# Patient Record
Sex: Female | Born: 1955 | Race: White | Hispanic: No | Marital: Single
Health system: Midwestern US, Academic
[De-identification: ages and names within clinical notes are randomized; demographics above are authoritative.]

---

## 2016-09-16 MED ORDER — AMIODARONE 200 MG PO TAB
100 mg | ORAL_TABLET | Freq: Every day | ORAL | 6 refills | 42.00000 days | Status: DC
Start: 2016-09-16 — End: 2017-06-11

## 2016-10-23 ENCOUNTER — Ambulatory Visit: Admit: 2016-10-23 | Discharge: 2016-10-24 | Payer: MEDICARE

## 2016-10-24 DIAGNOSIS — I4891 Unspecified atrial fibrillation: ICD-10-CM

## 2016-10-24 DIAGNOSIS — I10 Essential (primary) hypertension: ICD-10-CM

## 2016-10-24 DIAGNOSIS — I480 Paroxysmal atrial fibrillation: Principal | ICD-10-CM

## 2016-11-09 ENCOUNTER — Ambulatory Visit: Admit: 2016-11-09 | Discharge: 2016-11-10 | Payer: MEDICARE

## 2016-11-09 ENCOUNTER — Encounter: Admit: 2016-11-09 | Discharge: 2016-11-09 | Payer: MEDICARE

## 2016-11-09 ENCOUNTER — Ambulatory Visit: Admit: 2016-11-09 | Discharge: 2016-11-09 | Payer: MEDICARE

## 2016-11-09 DIAGNOSIS — I481 Persistent atrial fibrillation: Principal | ICD-10-CM

## 2016-11-09 MED ORDER — SODIUM CHLORIDE 0.9 % IJ SOLN
50 mL | Freq: Once | INTRAVENOUS | 0 refills | Status: CP
Start: 2016-11-09 — End: ?
  Administered 2016-11-09: 18:00:00 50 mL via INTRAVENOUS

## 2016-11-09 MED ORDER — IOPAMIDOL 76 % IV SOLN
100 mL | Freq: Once | INTRAVENOUS | 0 refills | Status: CP
Start: 2016-11-09 — End: ?
  Administered 2016-11-09: 18:00:00 100 mL via INTRAVENOUS

## 2016-11-09 NOTE — Progress Notes
Peripheral IV Insertion Note:  Patient Side: right  Line Orientation:Antecubital  IV Catheter Size: 18G  Number of Attempts:1.  IV capped and flushed with Normal Saline.  IV site without redness, swelling, or pain.  New dressing placed.    After procedure IV cannula removed intact and hemostasis achieved.

## 2016-11-13 ENCOUNTER — Encounter: Admit: 2016-11-13 | Discharge: 2016-11-13 | Payer: MEDICARE

## 2016-11-17 ENCOUNTER — Encounter: Admit: 2016-11-17 | Discharge: 2016-11-17 | Payer: MEDICARE

## 2016-11-23 ENCOUNTER — Ambulatory Visit: Admit: 2016-11-23 | Discharge: 2016-11-24 | Payer: MEDICARE

## 2016-11-24 DIAGNOSIS — I10 Essential (primary) hypertension: Secondary | ICD-10-CM

## 2016-11-24 DIAGNOSIS — I4891 Unspecified atrial fibrillation: Secondary | ICD-10-CM

## 2016-11-24 DIAGNOSIS — I480 Paroxysmal atrial fibrillation: Principal | ICD-10-CM

## 2016-11-26 ENCOUNTER — Encounter: Admit: 2016-11-26 | Discharge: 2016-11-26 | Payer: MEDICARE

## 2016-11-26 NOTE — Telephone Encounter
see message below regarding episodes of AF 6/14 to 7/4.  AT/AF burden is 69.2%    continues on amiodarone 100 mg per day.  texted  Reece Levy to review/advise regarding amiodarone dosing.         Per Dr Robbi Garter office visit on 09/16/16  She is currently taking 200 mg of amiodarone once a day. The decreased the dose of amiodarone to 100 mg once a day. I scheduled her to follow-up with me in about 3 months time.   Will review her device interrogation at that time.  If she does not have any recurrent atrial fibrillation then we will go ahead and stop her amiodarone at that point in time.

## 2016-11-26 NOTE — Telephone Encounter
-----   Message from Army Melia sent at 11/26/2016 11:37 AM CDT -----  Regarding: MPE LINQ pt with numerous AF events  AT/AF burden is 69.2%  Presenting EGM on 11/26/2016 @ 00:04:50 shows AF in the 50's-80's.     Reviewed Summary Report shows 105 AF events occurred:    #[id number] AF 6/14-7/4 lasting between 3 hrs 36 min - 56 hrs 38 min shows AF.     Please see scanned data sheets for further review.  Results routed to Dr. Artist Beach for signature and review. MPE is OOO

## 2016-11-27 ENCOUNTER — Encounter: Admit: 2016-11-27 | Discharge: 2016-11-27 | Payer: MEDICARE

## 2016-11-27 DIAGNOSIS — R9439 Abnormal result of other cardiovascular function study: Principal | ICD-10-CM

## 2016-11-27 DIAGNOSIS — I429 Cardiomyopathy, unspecified: ICD-10-CM

## 2016-11-27 MED ORDER — LIDOCAINE (PF) 10 MG/ML (1 %) IJ SOLN
.1-2 mL | INTRAMUSCULAR | 0 refills | Status: CN | PRN
Start: 2016-11-27 — End: ?

## 2016-11-27 NOTE — Telephone Encounter
Dr Reece Levy reviewed monitor recordings and stated to continue to monitor and no change in medications  talked with Bea and she does not remember any change in s/s.   Bea wanted me to call Pamala Hurry, her sister, and discuss with her  Pamala Hurry does remember one episode of dizziness about one week ago when she was with Jari Pigg and she was [facility] when standing  she will continue to monitor s/s

## 2016-11-28 ENCOUNTER — Encounter: Admit: 2016-11-28 | Discharge: 2016-11-28 | Payer: MEDICARE

## 2016-11-30 MED ORDER — XARELTO 20 MG PO TAB
ORAL_TABLET | Freq: Every day | ORAL | 11 refills | 30.00000 days | Status: AC
Start: 2016-11-30 — End: 2017-10-25

## 2016-12-04 ENCOUNTER — Encounter: Admit: 2016-12-04 | Discharge: 2016-12-04 | Payer: MEDICARE

## 2016-12-18 ENCOUNTER — Ambulatory Visit: Admit: 2016-12-18 | Discharge: 2016-12-19 | Payer: MEDICARE

## 2016-12-18 ENCOUNTER — Encounter: Admit: 2016-12-18 | Discharge: 2016-12-18 | Payer: MEDICARE

## 2016-12-18 DIAGNOSIS — I4891 Unspecified atrial fibrillation: ICD-10-CM

## 2016-12-18 DIAGNOSIS — I428 Other cardiomyopathies: ICD-10-CM

## 2016-12-18 DIAGNOSIS — D649 Anemia, unspecified: ICD-10-CM

## 2016-12-18 DIAGNOSIS — I5022 Chronic systolic (congestive) heart failure: ICD-10-CM

## 2016-12-18 DIAGNOSIS — I4819 Other persistent atrial fibrillation: Principal | ICD-10-CM

## 2016-12-18 DIAGNOSIS — I10 Essential (primary) hypertension: Principal | ICD-10-CM

## 2016-12-18 DIAGNOSIS — S301XXA Contusion of abdominal wall, initial encounter: ICD-10-CM

## 2016-12-18 DIAGNOSIS — F79 Unspecified intellectual disabilities: ICD-10-CM

## 2016-12-23 ENCOUNTER — Ambulatory Visit: Admit: 2016-12-23 | Discharge: 2016-12-24 | Payer: MEDICARE

## 2016-12-24 DIAGNOSIS — I4891 Unspecified atrial fibrillation: ICD-10-CM

## 2016-12-24 DIAGNOSIS — I10 Essential (primary) hypertension: Principal | ICD-10-CM

## 2017-01-02 ENCOUNTER — Encounter: Admit: 2017-01-02 | Discharge: 2017-01-02 | Payer: MEDICARE

## 2017-01-02 DIAGNOSIS — I4891 Unspecified atrial fibrillation: Principal | ICD-10-CM

## 2017-01-04 MED ORDER — PANTOPRAZOLE 40 MG PO TBEC
40 mg | ORAL_TABLET | Freq: Two times a day (BID) | ORAL | 3 refills | 90.00000 days | Status: AC
Start: 2017-01-04 — End: 2018-03-24

## 2017-01-08 ENCOUNTER — Ambulatory Visit: Admit: 2017-01-08 | Discharge: 2017-01-09 | Payer: MEDICARE

## 2017-01-08 DIAGNOSIS — I4819 Other persistent atrial fibrillation: Principal | ICD-10-CM

## 2017-01-26 ENCOUNTER — Ambulatory Visit: Admit: 2017-01-26 | Discharge: 2017-01-27 | Payer: MEDICARE

## 2017-01-27 DIAGNOSIS — I4891 Unspecified atrial fibrillation: ICD-10-CM

## 2017-01-27 DIAGNOSIS — I480 Paroxysmal atrial fibrillation: Principal | ICD-10-CM

## 2017-01-27 DIAGNOSIS — I10 Essential (primary) hypertension: ICD-10-CM

## 2017-02-12 ENCOUNTER — Encounter: Admit: 2017-02-12 | Discharge: 2017-02-12 | Payer: MEDICARE

## 2017-02-23 ENCOUNTER — Ambulatory Visit: Admit: 2017-02-23 | Discharge: 2017-02-24 | Payer: MEDICARE

## 2017-02-24 DIAGNOSIS — I10 Essential (primary) hypertension: Principal | ICD-10-CM

## 2017-02-24 DIAGNOSIS — I4891 Unspecified atrial fibrillation: ICD-10-CM

## 2017-03-05 ENCOUNTER — Encounter: Admit: 2017-03-05 | Discharge: 2017-03-05 | Payer: MEDICARE

## 2017-03-05 ENCOUNTER — Ambulatory Visit: Admit: 2017-03-05 | Discharge: 2017-03-06 | Payer: MEDICARE

## 2017-03-05 DIAGNOSIS — I4891 Unspecified atrial fibrillation: ICD-10-CM

## 2017-03-05 DIAGNOSIS — I480 Paroxysmal atrial fibrillation: Principal | ICD-10-CM

## 2017-03-05 DIAGNOSIS — S301XXA Contusion of abdominal wall, initial encounter: ICD-10-CM

## 2017-03-05 DIAGNOSIS — D649 Anemia, unspecified: ICD-10-CM

## 2017-03-05 DIAGNOSIS — F79 Unspecified intellectual disabilities: ICD-10-CM

## 2017-03-05 DIAGNOSIS — I4819 Other persistent atrial fibrillation: ICD-10-CM

## 2017-03-05 DIAGNOSIS — I10 Essential (primary) hypertension: Principal | ICD-10-CM

## 2017-03-05 MED ORDER — LOSARTAN 50 MG PO TAB
50 mg | ORAL_TABLET | Freq: Every day | ORAL | 3 refills | 90.00000 days | Status: AC
Start: 2017-03-05 — End: 2018-02-14

## 2017-03-05 MED ORDER — SPIRONOLACTONE 25 MG PO TAB
25 mg | ORAL_TABLET | Freq: Every day | ORAL | 3 refills | 90.00000 days | Status: AC
Start: 2017-03-05 — End: 2018-02-09

## 2017-03-05 NOTE — Progress Notes
HPI     I had the pleasure of seeing [name] at the [facility] at the [facility] office for followup evaluation of her electrophysiology issues.   ???  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 ???However, is high functioning.   ???  [name] underwent atrial fibrillation ablation with a cryoballoon technology on December 26, 2015. ???[name] is also having an increasing burden of AFib on her LINQ device despite being on Amiodarone. After extensive discussion we decided to pursue re-do Afib ablation on July 06 2016. The ablation procedure was uneventful. ???Since it was prior cryoablation we decided to perform a WACA she also underwent Cavotricuspid isthmus ablation. 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 ???She also underwent SVC isolation. ???She has not had any complications. ???  ???  I saw her a couple months ago when she was doing fairly well. But recently she went back into Afib again.??? Since her A. fib was recurring despite cardioversion and being on amiodarone we decided to declare her has permanent atrial fibrillation.  Her symptoms overall have improved with rate control.    She underwent a repeat echocardiogram to follow-up on her cardiomyopathy and her ejection fraction is persistently at 35%.  ???  EKG obtained here in the clinic shows normal atrial fibrillation with V rate of 66 BPM with a QRS duration of 98???ms and a corrected QT interval of 432???ms  ???  [name] is a pleasant 61 year old white female with:   1. Recurrent persistent atrial fibrillation, s/p Re-do afib ablation. ???  2. Hypertension.   3. Obesity. 4. Nonischemic cardiomyopathy, with last known ejection fraction of 35%  5. Heart failure NYHA class III from systolic dysfunction  6. Mental developmental delay.   ???  I discussed with [name] and her sister in detail regarding the pathophysiology, clinical features, and management of atrial fibrillation. ???I went over the findings of her ablation procedure.  ???  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  Her recent CT scan performed a few weeks ago showed even increase in the size of her left atrium, which is currently 250 mL.  I believe rhythm control in her is going to be very difficult therefore will stick to our initial plan of declaring her has permanent atrial fibrillation.    We have updated her on her rate control medications and has been fairly well controlled.    Unfortunately repeat echocardiogram despite aggressive rate control shows persistence of cardiomyopathy of 35%.  H    I went over the prognosis of severe cardiomyopathy.  I believe we can uptitrate her cardia myopathy medications.  I went ahead and initiated her on Cozaar 50 mg and Spironolactone 25 mg.  In about 2 weeks time she is going to get a reported blood work to check her electrolytes.    I schedule her to follow-up with me in about 2 months time.  At that time she is going to get a repeat echocardiogram.  If her LV ejection fraction persists to be at or below 35% then we should consider implanting the an ICD.  Since she is in permanent atrial fibrillation we can either do subcu ICD or  just a regular transvenous single lead ICD.      [name] and her sister have expressed understanding of the management plan.      I scheduled her to follow-up with me in about 2 months time.    Thank you for allowing [name] to participate in the care of [name].  If you have any questions regarding her management, please feel free to contact us.  ???         Vitals:    03/05/17 1327 03/05/17 1340   BP: 126/88 (!) 134/92   Pulse: 66 Weight: (!) 138.6 kg (305 lb 9.6 oz)    Height: 1.778 m (5' 10)      Body mass index is 43.85 kg/m???.     Past Medical History  Patient Active Problem List    Diagnosis Date Noted   ??? Groin hematoma 12/28/2015     12/26/15- patient developed hematoma left groin after left femoral vein access for atrial fibrillation ablation procedure     ??? Cardiomyopathy, nonischemic (HCC) 12/26/2015     -EF 25% by echo 08/20/15. Cardiac catheterization 10/2015 revealed no CAD.     ??? A-fib (HCC) 12/26/2015   ??? Chronic systolic heart failure (HCC) 11/07/2015   ??? Abnormal echocardiogram 11/07/2015   ??? Iron deficiency anemia 07/26/2015   ??? Hypertension    ??? Atrial fibrillation (HCC)      05-27-16: [facility] OV: Started on Coumadin 5 mg for new onset of A-Fib, then taken off and put on baby aspirin. Referred to cardiology for eval.  11/15/15: Cardiac cath: normal coronary angiography, normal LVEDP     ??? Mentally challenged          Review of Systems   Constitution: Negative.   HENT: Negative.    Eyes: Negative.    Cardiovascular: Negative.    Respiratory: Negative.    Endocrine: Negative.    Hematologic/Lymphatic: Negative.    Skin: Negative.    Musculoskeletal: Negative.    Gastrointestinal: Negative.    Genitourinary: Negative.    Neurological: Negative.    Psychiatric/Behavioral: Negative.    Allergic/Immunologic: Negative.        Physical Exam   Constitutional: She appears well-developed and well-nourished.   Obese   HENT:   Head: Normocephalic and atraumatic.   Eyes: Left eye exhibits no discharge. No scleral icterus.   Neck: No JVD present.   Cardiovascular:   S1 Variable, S2 Normal, Irregularly Irregular   Pulmonary/Chest: Effort normal and breath sounds normal. No respiratory distress. She has no wheezes. She has no rales. She exhibits no tenderness.   Abdominal: Soft. Bowel sounds are normal. She exhibits no distension and no mass. There is no tenderness. There is no rebound and no guarding. Musculoskeletal: She exhibits no edema or tenderness.   Lymphadenopathy:     She has no cervical adenopathy.   Neurological: She is alert and oriented to person, place, and time.   Skin: Skin is warm and dry.   Psychiatric: She has a normal mood and affect.                Current Medications (including today's revisions)  ??? amiodarone (CORDARONE) 200 mg tablet Take 0.5 tablets by mouth daily. Take with food.   ??? fluoxetine (PROZAC) 10 mg capsule Take 10 mg by mouth daily.   ??? metoprolol XL (TOPROL XL) 100 mg extended release tablet Take 100 mg by mouth daily.   ??? MYRBETRIQ 50 mg tablet Take 50 mg  by mouth daily.   ??? pantoprazole DR (PROTONIX) 40 mg tablet TAKE 1 TAB BY MOUTH TWICE DAILY.   ??? XARELTO 20 mg tablet TAKE 1 TABLET BY MOUTH EVERY DAY WITH BREAKFAST

## 2017-03-16 ENCOUNTER — Encounter: Admit: 2017-03-16 | Discharge: 2017-03-16 | Payer: MEDICARE

## 2017-03-16 MED ORDER — METOPROLOL SUCCINATE 100 MG PO TB24
ORAL_TABLET | Freq: Every evening | ORAL | 3 refills | 90.00000 days | Status: AC
Start: 2017-03-16 — End: 2017-12-21

## 2017-03-23 ENCOUNTER — Encounter: Admit: 2017-03-23 | Discharge: 2017-03-23 | Payer: MEDICARE

## 2017-03-23 NOTE — Telephone Encounter
-----   Message from Louis Meckel, LPN sent at [id number]/7615  4:34 PM CDT -----  Regarding: YMR- episode yesterday  VM from caregiver Pamala Hurry # [phone] on triage line .  Said that she had episode yesterday and she needs to see what YMR thinks about it.

## 2017-03-23 NOTE — Telephone Encounter
I spoke with patien't caregiver. She said patient "got weak, dizzy, felt funny". this was last evening while patient was showering.  I recommended a transmission from ILR and I will alert remote team.

## 2017-03-24 ENCOUNTER — Ambulatory Visit: Admit: 2017-03-24 | Discharge: 2017-03-25 | Payer: MEDICARE

## 2017-03-24 DIAGNOSIS — I4891 Unspecified atrial fibrillation: Secondary | ICD-10-CM

## 2017-03-24 NOTE — Telephone Encounter
Received remote.  showing AF.  Noted that previous device check mid October showed >99% AF burden.  Per last OV, YMR documented that they have determined that her AF is permanent.

## 2017-03-25 DIAGNOSIS — I10 Essential (primary) hypertension: Principal | ICD-10-CM

## 2017-03-26 NOTE — Telephone Encounter
Denise Meckel, LPN  [facility] Nurse Ep            VM on triage line from daughter Denise Boyd # [phone].   Denise Boyd that she had spell on Monday and sent transmission.   What did it show.      I called and discussed that there were no abnormal episodes.  We discussed that this was likely a blood pressure issue, and since we do not have a reading from that time, we will continue to monitor.  Daughter agreeable to plan with no further concerns or questions at this time.

## 2017-04-14 ENCOUNTER — Ambulatory Visit: Admit: 2017-04-14 | Discharge: 2017-04-15 | Payer: MEDICARE

## 2017-04-14 DIAGNOSIS — I480 Paroxysmal atrial fibrillation: Principal | ICD-10-CM

## 2017-04-14 MED ORDER — PERFLUTREN LIPID MICROSPHERES 1.1 MG/ML IV SUSP
1-20 mL | Freq: Once | INTRAVENOUS | 0 refills | Status: CP
Start: 2017-04-14 — End: ?

## 2017-04-14 NOTE — Progress Notes
Peripheral IV Insertion Note:  Patient Side: left  Line Orientation:Antecubital  IV Catheter Size: 20G  Number of Attempts:1.  IV capped and flushed with Normal Saline.  IV site without redness, swelling, or pain.  New dressing placed.    After procedure IV cannula removed intact and hemostasis achieved.    Procedure explained, questions answered and Definity administered per standard without complications.   Total of _2 ml of Definity/NS given slow IVP per sonographer direction.

## 2017-04-26 ENCOUNTER — Ambulatory Visit: Admit: 2017-04-26 | Discharge: 2017-04-27 | Payer: MEDICARE

## 2017-04-27 DIAGNOSIS — I10 Essential (primary) hypertension: ICD-10-CM

## 2017-04-27 DIAGNOSIS — I4891 Unspecified atrial fibrillation: Secondary | ICD-10-CM

## 2017-04-27 DIAGNOSIS — I480 Paroxysmal atrial fibrillation: Principal | ICD-10-CM

## 2017-04-30 ENCOUNTER — Encounter: Admit: 2017-04-30 | Discharge: 2017-04-30 | Payer: MEDICARE

## 2017-04-30 ENCOUNTER — Ambulatory Visit: Admit: 2017-04-30 | Discharge: 2017-05-01 | Payer: MEDICARE

## 2017-04-30 DIAGNOSIS — F79 Unspecified intellectual disabilities: ICD-10-CM

## 2017-04-30 DIAGNOSIS — D649 Anemia, unspecified: ICD-10-CM

## 2017-04-30 DIAGNOSIS — I4819 Other persistent atrial fibrillation: Principal | ICD-10-CM

## 2017-04-30 DIAGNOSIS — I4891 Unspecified atrial fibrillation: ICD-10-CM

## 2017-04-30 DIAGNOSIS — S301XXA Contusion of abdominal wall, initial encounter: ICD-10-CM

## 2017-04-30 DIAGNOSIS — I10 Essential (primary) hypertension: Principal | ICD-10-CM

## 2017-04-30 NOTE — Progress Notes
STAT:  Request for the following medical records for purpose of continuity of care:   Denise Boyd DOB [birth year] has an appointment with Dr. Reece Levy on NOW    Please send:  Pulmonary Function Test results  Please Fax to: [phone] Attention: [facility]  [address] [place] [address]  [phone]  Fax: [phone]

## 2017-04-30 NOTE — Progress Notes
HPI     I had the pleasure of seeing Denise Boyd at the [facility] at the [facility] office for followup evaluation of her electrophysiology issues.   ???  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 ???However, is high functioning.   ???  Denise Boyd underwent atrial fibrillation ablation with a cryoballoon technology on December 26, 2015. ???Denise Boyd was having an increasing burden of AFib on her LINQ device despite being on Amiodarone. After extensive discussion we decided to pursue re-do Afib ablation on July 06 2016. The ablation procedure was uneventful. ???Since it was prior cryoablation we decided to perform a WACA she also underwent Cavotricuspid isthmus ablation. 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 ???She also underwent SVC isolation. ???She has not had any complications. ???  ???  She did well for a couple months and then went back into Afib again.??? Since her A. fib was recurring despite cardioversion and being on amiodarone we decided to declare her has permanent atrial fibrillation.  Her symptoms overall have improved with rate control.  ???  She underwent a repeat echocardiogram to follow-up on her cardiomyopathy and her ejection fraction is persistently at 35% and with better medical treatment including rate control her EF improved to 40-45%.  ???  EKG obtained here in the clinic shows normal atrial fibrillation with V rate of 81 BPM with a QRS duration of 98???ms and a corrected QT interval of 465???ms  ???  Denise Boyd is a pleasant 61 year old white female with: 1. Recurrent persistent atrial fibrillation, s/p Re-do afib ablation declared permanent afib  2. Hypertension.   3. Obesity.   4. Nonischemic cardiomyopathy, with last known ejection fraction of 35%  5. Heart failure NYHA class III from systolic dysfunction  6. Mental developmental delay.   ???  I discussed with Denise Boyd and her sister in detail regarding the pathophysiology, clinical features, and management of atrial fibrillation. ???I went over the findings of her ablation procedure.  ???  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 ???Her repeat CT scan showed even increase in the size of her left atrium, which is currently 250 mL. ???I believe rhythm control in her is going to be very difficult therefore will declare her has permanent atrial fibrillation.  ???  We have updated her on her rate control medications and has been fairly well controlled.    Fortunately her EF improved and she would not need an ICD.     She has a LinQ in place. We can remove it anytime. Since its functioning well, we can continue it till she reaches RRT. She also has been having some pain at the site. It that's a concern, we can explant the device.    I will monitor for now. If she has increasing discomfort, I will go ahead and explant the device.    [name] and her sister have expressed understanding of the management plan.    ???  I scheduled her to follow-up with me in about 6 months time.  ???  Thank you for allowing [name] to participate in the care of Denise Boyd. ???If you have any questions regarding her management, please feel free to contact  us.         Vitals:    04/30/17 1515 04/30/17 1547   BP: 126/78 128/84   Pulse: 81    Weight: (!) 139.8 kg (308 lb 3.2 oz)    Height: 1.778 m (5' 10)      Body mass index is 44.22 kg/m???.     Past Medical History  Patient Active Problem List    Diagnosis Date Noted   ??? Groin hematoma 12/28/2015     12/26/15- patient developed hematoma left groin after left femoral vein access for atrial fibrillation ablation procedure     ??? Cardiomyopathy, nonischemic (HCC) 12/26/2015     -EF 25% by echo 08/20/15. Cardiac catheterization 10/2015 revealed no CAD.     ??? A-fib (HCC) 12/26/2015   ??? Chronic systolic heart failure (HCC) 11/07/2015   ??? Abnormal echocardiogram 11/07/2015   ??? Iron deficiency anemia 07/26/2015   ??? Hypertension    ??? Atrial fibrillation (HCC)      05-27-16: [facility] OV: Started on Coumadin 5 mg for new onset of A-Fib, then taken off and put on baby aspirin. Referred to cardiology for eval.  11/15/15: Cardiac cath: normal coronary angiography, normal LVEDP     ??? Mentally challenged          Review of Systems   Constitution: Negative.   HENT: Negative.    Eyes: Negative.    Cardiovascular: Negative.    Respiratory: Negative.    Endocrine: Negative.    Hematologic/Lymphatic: Negative.    Skin: Negative.    Musculoskeletal: Negative.    Gastrointestinal: Negative.    Genitourinary: Negative.    Neurological: Negative.    Psychiatric/Behavioral: Negative.    Allergic/Immunologic: Negative.        Physical Exam   Constitutional: She appears well-developed and well-nourished.   Obese   HENT:   Head: Normocephalic and atraumatic.   Eyes: Left eye exhibits no discharge. No scleral icterus.   Neck: No JVD present.   Cardiovascular:   S1 Variable, S2 Normal, Irregularly Irregular   Pulmonary/Chest: Effort normal and breath sounds normal. No respiratory distress. She has no wheezes. She has no rales. She exhibits no tenderness.   Abdominal: Soft. Bowel sounds are normal. She exhibits no distension and no mass. There is no tenderness. There is no rebound and no guarding.   Musculoskeletal: She exhibits no edema or tenderness.   Lymphadenopathy:     She has no cervical adenopathy.   Neurological: She is alert and oriented to person, place, and time.   Skin: Skin is warm and dry.   Psychiatric: She has a normal mood and affect. Current Medications (including today's revisions)  ??? amiodarone (CORDARONE) 200 mg tablet Take 0.5 tablets by mouth daily. Take with food.   ??? fluoxetine (PROZAC) 10 mg capsule Take 10 mg by mouth daily.   ??? losartan (COZAAR) 50 mg tablet Take one tablet by mouth daily.   ??? metoprolol XL (TOPROL XL) 100 mg extended release tablet TAKE 1 TABLET BY MOUTH AT BEDTIME   ??? MYRBETRIQ 50 mg tablet Take 50 mg by mouth daily.   ??? pantoprazole DR (PROTONIX) 40 mg tablet TAKE 1 TAB BY MOUTH TWICE DAILY.   ??? spironolactone (ALDACTONE) 25 mg tablet Take one tablet by mouth daily. Take with food.   ??? XARELTO 20 mg tablet TAKE 1 TABLET BY MOUTH EVERY DAY WITH BREAKFAST

## 2017-05-27 ENCOUNTER — Ambulatory Visit: Admit: 2017-05-27 | Discharge: 2017-05-28 | Payer: MEDICARE

## 2017-05-27 DIAGNOSIS — I10 Essential (primary) hypertension: Secondary | ICD-10-CM

## 2017-05-27 DIAGNOSIS — I480 Paroxysmal atrial fibrillation: Principal | ICD-10-CM

## 2017-05-28 ENCOUNTER — Encounter: Admit: 2017-05-28 | Discharge: 2017-05-28 | Payer: MEDICARE

## 2017-05-28 DIAGNOSIS — I4891 Unspecified atrial fibrillation: Secondary | ICD-10-CM

## 2017-05-28 DIAGNOSIS — I4819 Other persistent atrial fibrillation: Principal | ICD-10-CM

## 2017-06-03 ENCOUNTER — Encounter: Admit: 2017-06-03 | Discharge: 2017-06-03 | Payer: MEDICARE

## 2017-06-03 DIAGNOSIS — R52 Pain, unspecified: Principal | ICD-10-CM

## 2017-06-03 DIAGNOSIS — I480 Paroxysmal atrial fibrillation: ICD-10-CM

## 2017-06-03 DIAGNOSIS — I4891 Unspecified atrial fibrillation: Secondary | ICD-10-CM

## 2017-06-03 LAB — CBC AND DIFF
Lab: 0 % (ref 0–2)
Lab: 0 10*3/uL (ref 0–0.20)
Lab: 0.1 10*3/uL (ref 0–0.45)
Lab: 0.8 10*3/uL (ref 0–0.80)
Lab: 1 % (ref 0–5)
Lab: 11 10*3/uL — ABNORMAL HIGH (ref 1.0–4.8)
Lab: 15 10*3/uL — ABNORMAL HIGH (ref 4.5–11.0)
Lab: 3.2 10*3/uL (ref 1.8–7.0)
Lab: 5 % (ref 4–12)

## 2017-06-03 MED ORDER — CEFAZOLIN INJ 1GM IVP
1 g | INTRAVENOUS | 0 refills | Status: CN
Start: 2017-06-03 — End: ?

## 2017-06-03 MED ORDER — LIDOCAINE (PF) 10 MG/ML (1 %) IJ SOLN
.1-2 mL | INTRAMUSCULAR | 0 refills | Status: CN | PRN
Start: 2017-06-03 — End: ?

## 2017-06-03 MED ORDER — CEFAZOLIN INJ 1GM IVP
3 g | Freq: Once | INTRAVENOUS | 0 refills | Status: CN
Start: 2017-06-03 — End: ?

## 2017-06-04 ENCOUNTER — Encounter: Admit: 2017-06-04 | Discharge: 2017-06-04 | Payer: MEDICARE

## 2017-06-08 ENCOUNTER — Encounter: Admit: 2017-06-08 | Discharge: 2017-06-08 | Payer: MEDICARE

## 2017-06-08 DIAGNOSIS — I4891 Unspecified atrial fibrillation: Principal | ICD-10-CM

## 2017-06-08 MED ORDER — ALUMINUM-MAGNESIUM HYDROXIDE 200-200 MG/5 ML PO SUSP
30 mL | ORAL | 0 refills | Status: CN | PRN
Start: 2017-06-08 — End: ?

## 2017-06-08 MED ORDER — TEMAZEPAM 15 MG PO CAP
15 mg | Freq: Every evening | ORAL | 0 refills | Status: CN | PRN
Start: 2017-06-08 — End: ?

## 2017-06-08 MED ORDER — ACETAMINOPHEN 325 MG PO TAB
650 mg | ORAL | 0 refills | Status: CN | PRN
Start: 2017-06-08 — End: ?

## 2017-06-08 MED ORDER — MAGNESIUM HYDROXIDE 2,400 MG/10 ML PO SUSP
10 mL | ORAL | 0 refills | Status: CN | PRN
Start: 2017-06-08 — End: ?

## 2017-06-10 ENCOUNTER — Encounter: Admit: 2017-06-10 | Discharge: 2017-06-10 | Payer: MEDICARE

## 2017-06-10 DIAGNOSIS — I10 Essential (primary) hypertension: Principal | ICD-10-CM

## 2017-06-10 DIAGNOSIS — S301XXA Contusion of abdominal wall, initial encounter: ICD-10-CM

## 2017-06-10 DIAGNOSIS — I4891 Unspecified atrial fibrillation: ICD-10-CM

## 2017-06-10 DIAGNOSIS — F79 Unspecified intellectual disabilities: ICD-10-CM

## 2017-06-10 DIAGNOSIS — D649 Anemia, unspecified: ICD-10-CM

## 2017-06-11 ENCOUNTER — Encounter: Admit: 2017-06-11 | Discharge: 2017-06-11 | Payer: MEDICARE

## 2017-06-11 ENCOUNTER — Ambulatory Visit: Admit: 2017-06-11 | Discharge: 2017-06-11 | Payer: MEDICARE

## 2017-06-11 DIAGNOSIS — I10 Essential (primary) hypertension: Principal | ICD-10-CM

## 2017-06-11 DIAGNOSIS — I4891 Unspecified atrial fibrillation: ICD-10-CM

## 2017-06-11 DIAGNOSIS — R52 Pain, unspecified: ICD-10-CM

## 2017-06-11 DIAGNOSIS — D649 Anemia, unspecified: ICD-10-CM

## 2017-06-11 DIAGNOSIS — F79 Unspecified intellectual disabilities: ICD-10-CM

## 2017-06-11 DIAGNOSIS — I480 Paroxysmal atrial fibrillation: Principal | ICD-10-CM

## 2017-06-11 DIAGNOSIS — S301XXA Contusion of abdominal wall, initial encounter: ICD-10-CM

## 2017-06-11 LAB — BASIC METABOLIC PANEL
Lab: 0.7 mg/dL (ref 0.4–1.00)
Lab: 107 MMOL/L — ABNORMAL LOW (ref 98–110)
Lab: 135 MMOL/L — ABNORMAL LOW (ref 137–147)
Lab: 2 pg — ABNORMAL LOW (ref 3–12)
Lab: 26 MMOL/L — ABNORMAL LOW (ref 21–30)
Lab: 4.2 MMOL/L — ABNORMAL LOW (ref 3.5–5.1)
Lab: 9.8 mg/dL (ref 8.5–10.6)
Lab: 99 mg/dL — ABNORMAL LOW (ref 70–100)
Lab: >60 mL/min — ABNORMAL HIGH (ref >60)
Lab: >60 mL/min — ABNORMAL LOW (ref >60)

## 2017-06-11 MED ORDER — MAGNESIUM HYDROXIDE 2,400 MG/10 ML PO SUSP
10 mL | ORAL | 0 refills | Status: DC | PRN
Start: 2017-06-11 — End: 2017-06-11

## 2017-06-11 MED ORDER — LIDOCAINE (PF) 10 MG/ML (1 %) IJ SOLN
.1-2 mL | INTRAMUSCULAR | 0 refills | Status: DC | PRN
Start: 2017-06-11 — End: 2017-06-11

## 2017-06-11 MED ORDER — SODIUM CHLORIDE 0.9 % IV SOLP
1000 mL | INTRAVENOUS | 0 refills | Status: DC
Start: 2017-06-11 — End: 2017-06-11
  Administered 2017-06-11: 13:00:00 1000 mL via INTRAVENOUS

## 2017-06-11 MED ORDER — ALUMINUM-MAGNESIUM HYDROXIDE 200-200 MG/5 ML PO SUSP
30 mL | ORAL | 0 refills | Status: DC | PRN
Start: 2017-06-11 — End: 2017-06-11

## 2017-06-11 MED ORDER — CEFAZOLIN INJ 1GM IVP
3 g | Freq: Once | INTRAVENOUS | 0 refills | Status: CP
Start: 2017-06-11 — End: ?

## 2017-06-11 MED ORDER — CEFAZOLIN INJ 1GM IVP
1 g | INTRAVENOUS | 0 refills | Status: DC
Start: 2017-06-11 — End: 2017-06-11

## 2017-06-11 MED ORDER — TEMAZEPAM 15 MG PO CAP
15 mg | Freq: Every evening | ORAL | 0 refills | Status: DC | PRN
Start: 2017-06-11 — End: 2017-06-11

## 2017-06-11 MED ORDER — ACETAMINOPHEN 325 MG PO TAB
650 mg | ORAL | 0 refills | Status: DC | PRN
Start: 2017-06-11 — End: 2017-06-11

## 2017-06-11 MED ORDER — CEPHALEXIN 500 MG PO CAP
500 mg | ORAL_CAPSULE | Freq: Four times a day (QID) | ORAL | 0 refills | Status: AC
Start: 2017-06-11 — End: ?

## 2017-06-18 ENCOUNTER — Encounter: Admit: 2017-06-18 | Discharge: 2017-06-18 | Payer: MEDICARE

## 2017-06-28 ENCOUNTER — Ambulatory Visit: Admit: 2017-06-28 | Discharge: 2017-06-29 | Payer: MEDICARE

## 2017-06-29 DIAGNOSIS — I10 Essential (primary) hypertension: Principal | ICD-10-CM

## 2017-06-29 DIAGNOSIS — I4891 Unspecified atrial fibrillation: Secondary | ICD-10-CM

## 2017-08-16 ENCOUNTER — Encounter: Admit: 2017-08-16 | Discharge: 2017-08-16 | Payer: MEDICARE

## 2017-08-26 ENCOUNTER — Encounter: Admit: 2017-08-26 | Discharge: 2017-08-26 | Payer: MEDICARE

## 2017-08-26 DIAGNOSIS — I10 Essential (primary) hypertension: Principal | ICD-10-CM

## 2017-08-26 DIAGNOSIS — D649 Anemia, unspecified: ICD-10-CM

## 2017-08-26 DIAGNOSIS — S301XXA Contusion of abdominal wall, initial encounter: ICD-10-CM

## 2017-08-26 DIAGNOSIS — I4891 Unspecified atrial fibrillation: ICD-10-CM

## 2017-08-26 DIAGNOSIS — F79 Unspecified intellectual disabilities: ICD-10-CM

## 2017-09-30 ENCOUNTER — Encounter: Admit: 2017-09-30 | Discharge: 2017-09-30 | Payer: MEDICARE

## 2017-10-24 ENCOUNTER — Encounter: Admit: 2017-10-24 | Discharge: 2017-10-24 | Payer: MEDICARE

## 2017-10-25 MED ORDER — XARELTO 20 MG PO TAB
ORAL_TABLET | Freq: Every day | ORAL | 11 refills | 30.00000 days | Status: AC
Start: 2017-10-25 — End: 2018-04-25

## 2017-12-15 ENCOUNTER — Encounter: Admit: 2017-12-15 | Discharge: 2017-12-15 | Payer: MEDICARE

## 2017-12-15 DIAGNOSIS — I4891 Unspecified atrial fibrillation: Principal | ICD-10-CM

## 2017-12-15 MED ORDER — PANTOPRAZOLE 40 MG PO TBEC
ORAL_TABLET | Freq: Two times a day (BID) | 3 refills
Start: 2017-12-15 — End: ?

## 2017-12-20 ENCOUNTER — Encounter: Admit: 2017-12-20 | Discharge: 2017-12-20 | Payer: MEDICARE

## 2017-12-20 DIAGNOSIS — I4891 Unspecified atrial fibrillation: Principal | ICD-10-CM

## 2017-12-20 MED ORDER — PANTOPRAZOLE 40 MG PO TBEC
ORAL_TABLET | Freq: Two times a day (BID) | 3 refills
Start: 2017-12-20 — End: ?

## 2017-12-21 ENCOUNTER — Encounter: Admit: 2017-12-21 | Discharge: 2017-12-21 | Payer: MEDICARE

## 2017-12-21 MED ORDER — METOPROLOL SUCCINATE 100 MG PO TB24
ORAL_TABLET | Freq: Every day | ORAL | 0 refills | 90.00000 days | Status: AC
Start: 2017-12-21 — End: 2018-03-24

## 2018-01-17 ENCOUNTER — Encounter: Admit: 2018-01-17 | Discharge: 2018-01-17 | Payer: MEDICARE

## 2018-02-07 ENCOUNTER — Ambulatory Visit: Admit: 2018-02-07 | Discharge: 2018-02-08 | Payer: MEDICARE

## 2018-02-07 ENCOUNTER — Encounter: Admit: 2018-02-07 | Discharge: 2018-02-07 | Payer: MEDICARE

## 2018-02-07 DIAGNOSIS — N390 Urinary tract infection, site not specified: ICD-10-CM

## 2018-02-07 DIAGNOSIS — C8590 Non-Hodgkin lymphoma, unspecified, unspecified site: ICD-10-CM

## 2018-02-07 DIAGNOSIS — S301XXA Contusion of abdominal wall, initial encounter: ICD-10-CM

## 2018-02-07 DIAGNOSIS — F79 Unspecified intellectual disabilities: ICD-10-CM

## 2018-02-07 DIAGNOSIS — Z87440 Personal history of urinary (tract) infections: Secondary | ICD-10-CM

## 2018-02-07 DIAGNOSIS — I4891 Unspecified atrial fibrillation: Secondary | ICD-10-CM

## 2018-02-07 DIAGNOSIS — D649 Anemia, unspecified: ICD-10-CM

## 2018-02-07 DIAGNOSIS — I10 Essential (primary) hypertension: Principal | ICD-10-CM

## 2018-02-07 MED ORDER — ESTRADIOL 0.01 % (0.1 MG/GRAM) VA CREA
1 g | VAGINAL | 11 refills | 30.00000 days | Status: AC
Start: 2018-02-07 — End: 2018-03-24

## 2018-02-07 MED ORDER — NYSTATIN 100,000 UNIT/GRAM TP POWD
Freq: Two times a day (BID) | TOPICAL | 0 refills | 30.00000 days | Status: AC
Start: 2018-02-07 — End: ?

## 2018-02-07 MED ORDER — TROSPIUM 60 MG PO CP24
60 mg | ORAL_CAPSULE | Freq: Every day | ORAL | 3 refills | 30.00000 days | Status: AC
Start: 2018-02-07 — End: 2018-03-24

## 2018-02-08 DIAGNOSIS — F79 Unspecified intellectual disabilities: ICD-10-CM

## 2018-02-08 DIAGNOSIS — N3946 Mixed incontinence: Secondary | ICD-10-CM

## 2018-02-08 DIAGNOSIS — Z87448 Personal history of other diseases of urinary system: Principal | ICD-10-CM

## 2018-02-08 DIAGNOSIS — N952 Postmenopausal atrophic vaginitis: Secondary | ICD-10-CM

## 2018-02-09 ENCOUNTER — Encounter: Admit: 2018-02-09 | Discharge: 2018-02-09 | Payer: MEDICARE

## 2018-02-09 MED ORDER — SPIRONOLACTONE 25 MG PO TAB
25 mg | ORAL_TABLET | Freq: Every day | ORAL | 0 refills | 90.00000 days | Status: AC
Start: 2018-02-09 — End: 2018-04-25

## 2018-02-12 ENCOUNTER — Encounter: Admit: 2018-02-12 | Discharge: 2018-02-12 | Payer: MEDICARE

## 2018-02-14 ENCOUNTER — Encounter: Admit: 2018-02-14 | Discharge: 2018-02-14 | Payer: MEDICARE

## 2018-02-14 MED ORDER — LOSARTAN 50 MG PO TAB
ORAL_TABLET | Freq: Every day | ORAL | 0 refills | 90.00000 days | Status: AC
Start: 2018-02-14 — End: 2018-04-25

## 2018-02-15 ENCOUNTER — Encounter: Admit: 2018-02-15 | Discharge: 2018-02-15 | Payer: MEDICARE

## 2018-02-15 MED ORDER — FESOTERODINE 4 MG PO TB24
4 mg | ORAL_TABLET | Freq: Every day | ORAL | 3 refills | Status: AC
Start: 2018-02-15 — End: 2019-05-17

## 2018-03-24 ENCOUNTER — Encounter: Admit: 2018-03-24 | Discharge: 2018-03-24 | Payer: MEDICARE

## 2018-03-24 ENCOUNTER — Ambulatory Visit: Admit: 2018-03-24 | Discharge: 2018-03-25 | Payer: MEDICARE

## 2018-03-24 DIAGNOSIS — I4891 Unspecified atrial fibrillation: ICD-10-CM

## 2018-03-24 DIAGNOSIS — C8590 Non-Hodgkin lymphoma, unspecified, unspecified site: ICD-10-CM

## 2018-03-24 DIAGNOSIS — Z87440 Personal history of urinary (tract) infections: Principal | ICD-10-CM

## 2018-03-24 DIAGNOSIS — D649 Anemia, unspecified: ICD-10-CM

## 2018-03-24 DIAGNOSIS — D509 Iron deficiency anemia, unspecified: Principal | ICD-10-CM

## 2018-03-24 DIAGNOSIS — N390 Urinary tract infection, site not specified: ICD-10-CM

## 2018-03-24 DIAGNOSIS — D508 Other iron deficiency anemias: Principal | ICD-10-CM

## 2018-03-24 DIAGNOSIS — N3946 Mixed incontinence: ICD-10-CM

## 2018-03-24 DIAGNOSIS — I10 Essential (primary) hypertension: Principal | ICD-10-CM

## 2018-03-24 DIAGNOSIS — S301XXA Contusion of abdominal wall, initial encounter: ICD-10-CM

## 2018-03-24 DIAGNOSIS — F79 Unspecified intellectual disabilities: ICD-10-CM

## 2018-03-24 MED ORDER — SODIUM CHLORIDE 0.9 % IV SOLP
INTRAVENOUS | 0 refills | Status: CN
Start: 2018-03-24 — End: ?

## 2018-03-24 MED ORDER — PEG-ELECTROLYTE SOLN 420 GRAM PO SOLR
4 L | ORAL | 0 refills | Status: AC
Start: 2018-03-24 — End: 2019-05-17

## 2018-03-24 MED ORDER — METOPROLOL SUCCINATE 100 MG PO TB24
ORAL_TABLET | Freq: Every day | ORAL | 0 refills | 90.00000 days | Status: AC
Start: 2018-03-24 — End: 2018-04-25

## 2018-03-25 ENCOUNTER — Encounter: Admit: 2018-03-25 | Discharge: 2018-03-25 | Payer: MEDICARE

## 2018-03-25 DIAGNOSIS — D509 Iron deficiency anemia, unspecified: Principal | ICD-10-CM

## 2018-03-28 ENCOUNTER — Encounter: Admit: 2018-03-28 | Discharge: 2018-03-28 | Payer: MEDICARE

## 2018-03-30 ENCOUNTER — Encounter: Admit: 2018-03-30 | Discharge: 2018-03-30 | Payer: MEDICARE

## 2018-03-30 DIAGNOSIS — N3946 Mixed incontinence: ICD-10-CM

## 2018-03-30 DIAGNOSIS — N390 Urinary tract infection, site not specified: ICD-10-CM

## 2018-03-30 DIAGNOSIS — S301XXA Contusion of abdominal wall, initial encounter: ICD-10-CM

## 2018-03-30 DIAGNOSIS — D649 Anemia, unspecified: ICD-10-CM

## 2018-03-30 DIAGNOSIS — I10 Essential (primary) hypertension: Principal | ICD-10-CM

## 2018-03-30 DIAGNOSIS — N952 Postmenopausal atrophic vaginitis: Principal | ICD-10-CM

## 2018-03-30 DIAGNOSIS — I4891 Unspecified atrial fibrillation: ICD-10-CM

## 2018-03-30 DIAGNOSIS — C8590 Non-Hodgkin lymphoma, unspecified, unspecified site: ICD-10-CM

## 2018-03-30 DIAGNOSIS — Z87440 Personal history of urinary (tract) infections: ICD-10-CM

## 2018-03-30 DIAGNOSIS — F79 Unspecified intellectual disabilities: ICD-10-CM

## 2018-03-30 MED ORDER — ESTRADIOL ACETATE 0.05 MG/24 HR VA RING
4 refills | 30.00000 days | Status: AC
Start: 2018-03-30 — End: 2019-05-17

## 2018-04-06 ENCOUNTER — Encounter: Admit: 2018-04-06 | Discharge: 2018-04-06 | Payer: MEDICARE

## 2018-04-06 DIAGNOSIS — R32 Unspecified urinary incontinence: Principal | ICD-10-CM

## 2018-04-06 DIAGNOSIS — R399 Unspecified symptoms and signs involving the genitourinary system: ICD-10-CM

## 2018-04-14 ENCOUNTER — Encounter: Admit: 2018-04-14 | Discharge: 2018-04-14 | Payer: MEDICARE

## 2018-04-25 ENCOUNTER — Ambulatory Visit: Admit: 2018-04-25 | Discharge: 2018-04-26 | Payer: MEDICARE

## 2018-04-25 ENCOUNTER — Encounter: Admit: 2018-04-25 | Discharge: 2018-04-25 | Payer: MEDICARE

## 2018-04-25 DIAGNOSIS — D649 Anemia, unspecified: ICD-10-CM

## 2018-04-25 DIAGNOSIS — I10 Essential (primary) hypertension: ICD-10-CM

## 2018-04-25 DIAGNOSIS — I5022 Chronic systolic (congestive) heart failure: ICD-10-CM

## 2018-04-25 DIAGNOSIS — I428 Other cardiomyopathies: ICD-10-CM

## 2018-04-25 DIAGNOSIS — I4891 Unspecified atrial fibrillation: ICD-10-CM

## 2018-04-25 DIAGNOSIS — S301XXA Contusion of abdominal wall, initial encounter: ICD-10-CM

## 2018-04-25 DIAGNOSIS — N390 Urinary tract infection, site not specified: ICD-10-CM

## 2018-04-25 DIAGNOSIS — C8590 Non-Hodgkin lymphoma, unspecified, unspecified site: ICD-10-CM

## 2018-04-25 DIAGNOSIS — F79 Unspecified intellectual disabilities: ICD-10-CM

## 2018-04-25 MED ORDER — RIVAROXABAN 20 MG PO TAB
20 mg | ORAL_TABLET | Freq: Every day | ORAL | 11 refills | 30.00000 days | Status: AC
Start: 2018-04-25 — End: 2019-03-31

## 2018-04-25 MED ORDER — SPIRONOLACTONE 25 MG PO TAB
25 mg | ORAL_TABLET | Freq: Every day | ORAL | 0 refills | 90.00000 days | Status: AC
Start: 2018-04-25 — End: 2018-06-06

## 2018-04-25 MED ORDER — LOSARTAN 50 MG PO TAB
50 mg | ORAL_TABLET | Freq: Every day | ORAL | 3 refills | 30.00000 days | Status: AC
Start: 2018-04-25 — End: 2018-06-06

## 2018-04-25 MED ORDER — METOPROLOL SUCCINATE 50 MG PO TB24
50 mg | ORAL_TABLET | Freq: Every day | ORAL | 3 refills | 90.00000 days | Status: AC
Start: 2018-04-25 — End: 2018-06-06

## 2018-04-26 ENCOUNTER — Encounter: Admit: 2018-04-26 | Discharge: 2018-04-26 | Payer: MEDICARE

## 2018-05-31 ENCOUNTER — Ambulatory Visit: Admit: 2018-05-31 | Discharge: 2018-05-31 | Payer: MEDICARE

## 2018-05-31 ENCOUNTER — Encounter: Admit: 2018-05-31 | Discharge: 2018-05-31 | Payer: MEDICARE

## 2018-05-31 DIAGNOSIS — Z7901 Long term (current) use of anticoagulants: ICD-10-CM

## 2018-05-31 DIAGNOSIS — I4891 Unspecified atrial fibrillation: Secondary | ICD-10-CM

## 2018-05-31 DIAGNOSIS — K648 Other hemorrhoids: Secondary | ICD-10-CM

## 2018-05-31 DIAGNOSIS — E669 Obesity, unspecified: Secondary | ICD-10-CM

## 2018-05-31 DIAGNOSIS — K633 Ulcer of intestine: Secondary | ICD-10-CM

## 2018-05-31 DIAGNOSIS — C8590 Non-Hodgkin lymphoma, unspecified, unspecified site: Secondary | ICD-10-CM

## 2018-05-31 DIAGNOSIS — I428 Other cardiomyopathies: ICD-10-CM

## 2018-05-31 DIAGNOSIS — K6389 Other specified diseases of intestine: ICD-10-CM

## 2018-05-31 DIAGNOSIS — F79 Unspecified intellectual disabilities: Secondary | ICD-10-CM

## 2018-05-31 DIAGNOSIS — D509 Iron deficiency anemia, unspecified: Secondary | ICD-10-CM

## 2018-05-31 DIAGNOSIS — I10 Essential (primary) hypertension: Secondary | ICD-10-CM

## 2018-05-31 DIAGNOSIS — D649 Anemia, unspecified: Secondary | ICD-10-CM

## 2018-05-31 DIAGNOSIS — I890 Lymphedema, not elsewhere classified: Secondary | ICD-10-CM

## 2018-05-31 DIAGNOSIS — S301XXA Contusion of abdominal wall, initial encounter: Secondary | ICD-10-CM

## 2018-05-31 DIAGNOSIS — N390 Urinary tract infection, site not specified: Secondary | ICD-10-CM

## 2018-05-31 MED ORDER — TAP WATER/SIMETHICONE IRRIGATION
0 refills | Status: DC
Start: 2018-05-31 — End: 2018-05-31
  Administered 2018-05-31: 15:00:00 60 mL

## 2018-05-31 MED ORDER — LIDOCAINE (PF) 200 MG/10 ML (2 %) IJ SYRG
0 refills | Status: DC
Start: 2018-05-31 — End: 2018-05-31
  Administered 2018-05-31: 14:00:00 120 mg via INTRAVENOUS

## 2018-05-31 MED ORDER — LACTATED RINGERS IV SOLP
1000 mL | Freq: Once | INTRAVENOUS | 0 refills | Status: CP
Start: 2018-05-31 — End: ?
  Administered 2018-05-31: 14:00:00 1000 mL via INTRAVENOUS

## 2018-05-31 MED ORDER — PROPOFOL 10 MG/ML IV EMUL 50 ML (INFUSION)(AM)(OR)
INTRAVENOUS | 0 refills | Status: DC
Start: 2018-05-31 — End: 2018-05-31
  Administered 2018-05-31: 14:00:00 110 ug/kg/min via INTRAVENOUS

## 2018-05-31 MED ORDER — PROPOFOL INJ 10 MG/ML IV VIAL
0 refills | Status: DC
Start: 2018-05-31 — End: 2018-05-31
  Administered 2018-05-31: 15:00:00 20 mg via INTRAVENOUS
  Administered 2018-05-31 (×2): 50 mg via INTRAVENOUS
  Administered 2018-05-31: 15:00:00 30 mg via INTRAVENOUS
  Administered 2018-05-31: 14:00:00 60 mg via INTRAVENOUS
  Administered 2018-05-31 (×2): 30 mg via INTRAVENOUS

## 2018-05-31 MED ORDER — LACTATED RINGERS IV SOLP
0 refills | Status: DC
Start: 2018-05-31 — End: 2018-05-31
  Administered 2018-05-31: 14:00:00 via INTRAVENOUS

## 2018-05-31 MED ORDER — FENTANYL CITRATE (PF) 50 MCG/ML IJ SOLN
0 refills | Status: DC
Start: 2018-05-31 — End: 2018-05-31
  Administered 2018-05-31 (×3): 25 ug via INTRAVENOUS

## 2018-06-01 ENCOUNTER — Ambulatory Visit: Admit: 2018-06-01 | Discharge: 2018-06-01 | Payer: MEDICARE

## 2018-06-02 ENCOUNTER — Encounter: Admit: 2018-06-02 | Discharge: 2018-06-02 | Payer: MEDICARE

## 2018-06-02 DIAGNOSIS — S301XXA Contusion of abdominal wall, initial encounter: Secondary | ICD-10-CM

## 2018-06-02 DIAGNOSIS — I4891 Unspecified atrial fibrillation: Secondary | ICD-10-CM

## 2018-06-02 DIAGNOSIS — I10 Essential (primary) hypertension: Secondary | ICD-10-CM

## 2018-06-02 DIAGNOSIS — N390 Urinary tract infection, site not specified: Secondary | ICD-10-CM

## 2018-06-02 DIAGNOSIS — F79 Unspecified intellectual disabilities: Secondary | ICD-10-CM

## 2018-06-02 DIAGNOSIS — C8590 Non-Hodgkin lymphoma, unspecified, unspecified site: Secondary | ICD-10-CM

## 2018-06-02 DIAGNOSIS — D649 Anemia, unspecified: Secondary | ICD-10-CM

## 2018-06-06 ENCOUNTER — Encounter: Admit: 2018-06-06 | Discharge: 2018-06-06 | Payer: MEDICARE

## 2018-06-06 MED ORDER — SPIRONOLACTONE 25 MG PO TAB
ORAL_TABLET | Freq: Every day | ORAL | 11 refills | 46.00000 days | Status: AC
Start: 2018-06-06 — End: 2018-07-18

## 2018-06-06 MED ORDER — PANTOPRAZOLE 40 MG PO TBEC
ORAL_TABLET | Freq: Two times a day (BID) | 0 refills
Start: 2018-06-06 — End: ?

## 2018-06-06 MED ORDER — METOPROLOL SUCCINATE 50 MG PO TB24
50 mg | ORAL_TABLET | Freq: Every day | ORAL | 11 refills | 90.00000 days | Status: AC
Start: 2018-06-06 — End: 2019-05-01

## 2018-06-06 MED ORDER — LOSARTAN 50 MG PO TAB
ORAL_TABLET | Freq: Every morning | ORAL | 11 refills | 30.00000 days | Status: AC
Start: 2018-06-06 — End: 2019-05-01

## 2018-06-07 ENCOUNTER — Encounter: Admit: 2018-06-07 | Discharge: 2018-06-07 | Payer: MEDICARE

## 2018-06-07 DIAGNOSIS — K2970 Gastritis, unspecified, without bleeding: Secondary | ICD-10-CM

## 2018-06-10 ENCOUNTER — Encounter: Admit: 2018-06-10 | Discharge: 2018-06-10 | Payer: MEDICARE

## 2018-07-18 ENCOUNTER — Encounter: Admit: 2018-07-18 | Discharge: 2018-07-18 | Payer: MEDICARE

## 2018-07-18 MED ORDER — SPIRONOLACTONE 25 MG PO TAB
ORAL_TABLET | ORAL | 3 refills | 90.00000 days | Status: AC
Start: 2018-07-18 — End: 2019-05-01

## 2019-03-31 ENCOUNTER — Encounter: Admit: 2019-03-31 | Discharge: 2019-03-31 | Payer: MEDICARE

## 2019-03-31 MED ORDER — XARELTO 20 MG PO TAB
ORAL_TABLET | Freq: Every day | ORAL | 3 refills | 30.00000 days | Status: AC
Start: 2019-03-31 — End: ?

## 2019-03-31 NOTE — Telephone Encounter
Pt due for OV. Message sent to scheduling to facilitate.

## 2019-04-29 ENCOUNTER — Encounter: Admit: 2019-04-29 | Discharge: 2019-04-29 | Payer: MEDICARE

## 2019-05-01 MED ORDER — METOPROLOL SUCCINATE 50 MG PO TB24
ORAL_TABLET | Freq: Every day | ORAL | 1 refills | 90.00000 days | Status: DC
Start: 2019-05-01 — End: 2019-07-05

## 2019-05-01 MED ORDER — LOSARTAN 50 MG PO TAB
ORAL_TABLET | Freq: Every morning | ORAL | 1 refills | 90.00000 days | Status: DC
Start: 2019-05-01 — End: 2019-07-05

## 2019-05-01 MED ORDER — SPIRONOLACTONE 25 MG PO TAB
ORAL_TABLET | Freq: Every day | ORAL | 1 refills | 90.00000 days | Status: DC
Start: 2019-05-01 — End: 2019-05-17

## 2019-05-05 ENCOUNTER — Encounter: Admit: 2019-05-05 | Discharge: 2019-05-05 | Payer: MEDICARE

## 2019-05-17 ENCOUNTER — Encounter: Admit: 2019-05-17 | Discharge: 2019-05-17 | Payer: MEDICARE

## 2019-05-17 DIAGNOSIS — S301XXA Contusion of abdominal wall, initial encounter: Secondary | ICD-10-CM

## 2019-05-17 DIAGNOSIS — I4891 Unspecified atrial fibrillation: Secondary | ICD-10-CM

## 2019-05-17 DIAGNOSIS — I10 Essential (primary) hypertension: Secondary | ICD-10-CM

## 2019-05-17 DIAGNOSIS — D649 Anemia, unspecified: Secondary | ICD-10-CM

## 2019-05-17 DIAGNOSIS — N390 Urinary tract infection, site not specified: Secondary | ICD-10-CM

## 2019-05-17 DIAGNOSIS — C8590 Non-Hodgkin lymphoma, unspecified, unspecified site: Secondary | ICD-10-CM

## 2019-05-17 DIAGNOSIS — I4821 Permanent atrial fibrillation: Secondary | ICD-10-CM

## 2019-05-17 DIAGNOSIS — F79 Unspecified intellectual disabilities: Secondary | ICD-10-CM

## 2019-05-17 MED ORDER — SPIRONOLACTONE 50 MG PO TAB
ORAL_TABLET | Freq: Every day | ORAL | 3 refills | 46.00000 days | Status: AC
Start: 2019-05-17 — End: ?

## 2019-07-04 ENCOUNTER — Encounter: Admit: 2019-07-04 | Discharge: 2019-07-04 | Payer: MEDICARE

## 2019-07-05 ENCOUNTER — Encounter: Admit: 2019-07-05 | Discharge: 2019-07-05 | Payer: MEDICARE

## 2019-07-05 MED ORDER — LOSARTAN 50 MG PO TAB
50 mg | ORAL_TABLET | Freq: Every day | ORAL | 0 refills | 90.00000 days | Status: DC
Start: 2019-07-05 — End: 2019-08-01

## 2019-07-05 MED ORDER — METOPROLOL SUCCINATE 50 MG PO TB24
50 mg | ORAL_TABLET | Freq: Every evening | ORAL | 0 refills | 90.00000 days | Status: DC
Start: 2019-07-05 — End: 2019-08-01

## 2019-07-05 NOTE — Progress Notes
Request for the following medical records, for the purpose Continuity of Care.     Please send the following:     ? Most Recent Labs     Please Fax to:   FAX#: [phone]  Attn: Sherry

## 2019-08-01 ENCOUNTER — Encounter: Admit: 2019-08-01 | Discharge: 2019-08-01 | Payer: MEDICARE

## 2019-08-01 MED ORDER — LOSARTAN 50 MG PO TAB
ORAL_TABLET | Freq: Every morning | ORAL | 0 refills | 90.00000 days | Status: DC
Start: 2019-08-01 — End: 2019-10-04

## 2019-08-01 MED ORDER — METOPROLOL SUCCINATE 50 MG PO TB24
ORAL_TABLET | Freq: Every day | ORAL | 1 refills | 90.00000 days | Status: DC
Start: 2019-08-01 — End: 2020-01-01

## 2019-10-04 ENCOUNTER — Encounter: Admit: 2019-10-04 | Discharge: 2019-10-04 | Payer: MEDICARE

## 2019-10-04 MED ORDER — LOSARTAN 50 MG PO TAB
ORAL_TABLET | Freq: Every morning | ORAL | 0 refills | 30.00000 days | Status: DC
Start: 2019-10-04 — End: 2019-10-04

## 2019-10-04 MED ORDER — LOSARTAN 50 MG PO TAB
50 mg | ORAL_TABLET | Freq: Every day | ORAL | 3 refills | 90.00000 days | Status: AC
Start: 2019-10-04 — End: ?

## 2019-10-04 NOTE — Telephone Encounter
Pt is overdue for labs for med refills. Called pt and spoke to sister, caregiver. She states pt had labs drawn at PCP. Will request labs. Losartan refilled.

## 2019-10-05 ENCOUNTER — Encounter: Admit: 2019-10-05 | Discharge: 2019-10-05 | Payer: MEDICARE

## 2019-10-05 NOTE — Progress Notes
Request for the following medical records, for the purpose Continuity of Care.     Please send the following:     Most Recent Labs     Please Fax to:   FAX#: [phone]  Attn: Sherry

## 2019-10-10 ENCOUNTER — Encounter: Admit: 2019-10-10 | Discharge: 2019-10-10 | Payer: MEDICARE

## 2019-11-01 ENCOUNTER — Encounter: Admit: 2019-11-01 | Discharge: 2019-11-01 | Payer: MEDICARE

## 2019-11-01 MED ORDER — LOSARTAN 50 MG PO TAB
50 mg | ORAL_TABLET | Freq: Every day | ORAL | 3 refills
Start: 2019-11-01 — End: ?

## 2020-01-01 ENCOUNTER — Encounter: Admit: 2020-01-01 | Discharge: 2020-01-01 | Payer: MEDICARE

## 2020-01-01 MED ORDER — METOPROLOL SUCCINATE 50 MG PO TB24
ORAL_TABLET | Freq: Every day | ORAL | 3 refills | 90.00000 days | Status: AC
Start: 2020-01-01 — End: ?

## 2020-02-15 ENCOUNTER — Encounter: Admit: 2020-02-15 | Discharge: 2020-02-15 | Payer: MEDICARE

## 2020-02-28 ENCOUNTER — Ambulatory Visit: Admit: 2020-02-28 | Discharge: 2020-02-29 | Payer: MEDICARE

## 2020-02-28 ENCOUNTER — Encounter: Admit: 2020-02-28 | Discharge: 2020-02-28 | Payer: MEDICARE

## 2020-02-28 DIAGNOSIS — D649 Anemia, unspecified: Secondary | ICD-10-CM

## 2020-02-28 DIAGNOSIS — I10 Essential (primary) hypertension: Secondary | ICD-10-CM

## 2020-02-28 DIAGNOSIS — F79 Unspecified intellectual disabilities: Secondary | ICD-10-CM

## 2020-02-28 DIAGNOSIS — N390 Urinary tract infection, site not specified: Secondary | ICD-10-CM

## 2020-02-28 DIAGNOSIS — S301XXA Contusion of abdominal wall, initial encounter: Secondary | ICD-10-CM

## 2020-02-28 DIAGNOSIS — C8590 Non-Hodgkin lymphoma, unspecified, unspecified site: Secondary | ICD-10-CM

## 2020-02-28 DIAGNOSIS — I428 Other cardiomyopathies: Principal | ICD-10-CM

## 2020-02-28 DIAGNOSIS — I4891 Unspecified atrial fibrillation: Secondary | ICD-10-CM

## 2020-02-28 NOTE — Patient Instructions
ECHO: SCHEDULE BEFORE LEAVING TODAY    Please call [phone] to  schedule your echocardiogram and follow up . We will call you with results in apx 7-10 days after you have it completed.    FOLLOW UP APPOINTMENT: PLEASE SCHEDULE BEFORE LEAVING TODAY    Please call scheduling at [phone]  to schedule your 3 month follow up appointment with Dr. Sandria Manly.    *Please arrive 15 minutes before appointment time to allow our team to perform intake and room you appropriately*    In order to provide you the best care possible we ask that you follow up as below:    ? For questions, please call a team member on Dr. Doris Cheadle nursing line at [phone] Monday - Friday 8-5 only.         Please leave a detailed message with your name, date of birth, and reason for your call.  A nurse will return your call as soon as possible.     ? You may also send [name] questions through your MyChart account.     ? For all medication refills please contact your pharmacy or send a request through MyChart.     ? Lab and test results:  As a part of the CARES act, starting 08/24/2019, some results will be released to you via mychart immediately and automatically.  You may see results before your provider sees them; however, your provider will review all these results and then they, or one of their team, will notify you of result information and recommendations.   Critical results will be addressed immediately, but otherwise, please allow [name] time to get back with you prior to you reaching out to [name] for questions.  This will usually take about 72 hours for labs and 5-7 days for procedure test results.        1. To schedule an appointment call [phone]  2. To Schedule a procedure/Test call [phone]  3. To schedule with EP call [phone]     It was truly our pleasure seeing you today. Thank you for choosing [facility] for your heart health!    Instructions given by Caleen Jobs, RN

## 2020-02-29 ENCOUNTER — Encounter: Admit: 2020-02-29 | Discharge: 2020-02-29 | Payer: MEDICARE

## 2020-02-29 DIAGNOSIS — Z01810 Encounter for preprocedural cardiovascular examination: Secondary | ICD-10-CM

## 2020-02-29 MED ORDER — XARELTO 20 MG PO TAB
ORAL_TABLET | Freq: Every day | ORAL | 3 refills | 30.00000 days | Status: AC
Start: 2020-02-29 — End: ?

## 2020-03-01 ENCOUNTER — Encounter: Admit: 2020-03-01 | Discharge: 2020-03-01 | Payer: MEDICARE

## 2020-03-01 NOTE — Telephone Encounter
03/01/2020 12:10 PM    Call rec'd from [place] [employer] [employer] at [facility] surgical center in [place]. Ret'd call, she voices pt was seen 10/6 by ABM for surgical clearance and pt is scheduled and awaiting to have today. States ABM letter rec'd reads the pt can hold Xarelto prior to surgery but does not specifically address whether she is safe to proceed with the surgery. Reports pt was scheduled for Echo yesterday but she told them she canceled it. Wanting to know what ABM plans were, was he going to view her echo and then approve/disapprove surgery based on the echo? States surgical team is reluctant to proceed with surgery today if echo has not been done and reviewed by CV. Message sent to clinic nurse.    03/01/2020 12:55 PM  Response from ABM,advised no surgery until echo is obtained. Called and spoke with Mardella Layman and informed her of ABM response . Informed if pt wants to have done there I can order for ABM or gave scheduling number at [place] if pt prefers to have done here can call at her convenience.

## 2020-03-04 ENCOUNTER — Encounter: Admit: 2020-03-04 | Discharge: 2020-03-04 | Payer: MEDICARE

## 2020-04-29 ENCOUNTER — Encounter: Admit: 2020-04-29 | Discharge: 2020-04-29 | Payer: MEDICARE

## 2020-04-29 MED ORDER — SPIRONOLACTONE 50 MG PO TAB
50 mg | ORAL_TABLET | Freq: Every day | ORAL | 0 refills | 46.00000 days | Status: AC
Start: 2020-04-29 — End: ?

## 2020-04-29 NOTE — Progress Notes
STAT     Request for the following medical records, for the purpose Continuity of Care.     Please send the following:      All Labs from May 2020 to Recent     Please Fax to:   FAX#: [phone]  Attn: Sherry/Cheryl

## 2020-05-02 ENCOUNTER — Encounter: Admit: 2020-05-02 | Discharge: 2020-05-02 | Payer: MEDICARE

## 2020-05-16 ENCOUNTER — Encounter: Admit: 2020-05-16 | Discharge: 2020-05-16 | Payer: MEDICARE

## 2020-05-16 MED ORDER — LOSARTAN 50 MG PO TAB
50 mg | ORAL_TABLET | Freq: Every day | ORAL | 3 refills | 30.00000 days | Status: AC
Start: 2020-05-16 — End: ?

## 2020-06-05 ENCOUNTER — Encounter: Admit: 2020-06-05 | Discharge: 2020-06-05 | Payer: MEDICARE

## 2020-06-18 ENCOUNTER — Encounter: Admit: 2020-06-18 | Discharge: 2020-06-18 | Payer: MEDICARE

## 2020-06-18 DIAGNOSIS — I428 Other cardiomyopathies: Secondary | ICD-10-CM

## 2020-06-18 DIAGNOSIS — I10 Essential (primary) hypertension: Secondary | ICD-10-CM

## 2020-06-18 DIAGNOSIS — D649 Anemia, unspecified: Secondary | ICD-10-CM

## 2020-06-18 DIAGNOSIS — I4891 Unspecified atrial fibrillation: Secondary | ICD-10-CM

## 2020-06-18 DIAGNOSIS — I4821 Permanent atrial fibrillation: Secondary | ICD-10-CM

## 2020-06-18 DIAGNOSIS — S301XXA Contusion of abdominal wall, initial encounter: Secondary | ICD-10-CM

## 2020-06-18 DIAGNOSIS — N390 Urinary tract infection, site not specified: Secondary | ICD-10-CM

## 2020-06-18 DIAGNOSIS — F79 Unspecified intellectual disabilities: Secondary | ICD-10-CM

## 2020-06-18 DIAGNOSIS — I5020 Unspecified systolic (congestive) heart failure: Secondary | ICD-10-CM

## 2020-06-18 DIAGNOSIS — C8590 Non-Hodgkin lymphoma, unspecified, unspecified site: Secondary | ICD-10-CM

## 2020-06-18 NOTE — Telephone Encounter
Received msg on nurse line from [facility] in [facility] Scheduling asking for call back to [phone] to confirm pt's current insurance. Left msg for Tammy confirming pt has [employer] and faxed copy of pt's card scanned to chart today, to her Attn at [phone]. Left [facility] nurse line for call back prn. No further needs identified at this time.

## 2020-07-24 ENCOUNTER — Encounter: Admit: 2020-07-24 | Discharge: 2020-07-24 | Payer: MEDICARE

## 2020-07-24 ENCOUNTER — Ambulatory Visit: Admit: 2020-07-24 | Discharge: 2020-07-24 | Payer: MEDICARE

## 2020-07-24 DIAGNOSIS — I10 Essential (primary) hypertension: Secondary | ICD-10-CM

## 2020-07-25 ENCOUNTER — Encounter: Admit: 2020-07-25 | Discharge: 2020-07-25 | Payer: MEDICARE

## 2020-07-25 NOTE — Telephone Encounter
-----   Message from Benna Dunks, MD sent at 07/25/2020  4:47 PM CST -----  Marykay Lex guys,You mind reaching out to Bea and letting her know that her echocardiogram results looked okay.  Unchanged from her previous study in 2018.  Her left ventricular systolic function or heart pump function, is only slightly reduced.  No changes to current plan.Thanks!

## 2020-07-25 NOTE — Telephone Encounter
Results and recommendations called to patient's sister/caretaker Barb. Raford Pitcher has no questions at this time.

## 2020-07-29 ENCOUNTER — Encounter: Admit: 2020-07-29 | Discharge: 2020-07-29 | Payer: MEDICARE

## 2020-07-29 MED ORDER — SPIRONOLACTONE 50 MG PO TAB
ORAL_TABLET | Freq: Every day | ORAL | 1 refills | 46.00000 days | Status: AC
Start: 2020-07-29 — End: ?

## 2020-08-27 ENCOUNTER — Encounter: Admit: 2020-08-27 | Discharge: 2020-08-27 | Payer: MEDICARE

## 2020-08-27 MED ORDER — LOSARTAN 50 MG PO TAB
ORAL_TABLET | Freq: Every morning | ORAL | 3 refills | 30.00000 days | Status: AC
Start: 2020-08-27 — End: ?

## 2020-09-07 IMAGING — CT Thorax^1_CHEST_WITH (Adult)
1 series · 14 of 32 positions shown, 18 images · IV contrast (APPLIED)
Comparison: none

[Series 1: chest 5.0 soft tissue · axial · 0.80mm/px · z∈[-288,-28]mm · 14 of 58 slices shown, 18 images]
[im 4/58  soft-tissue]
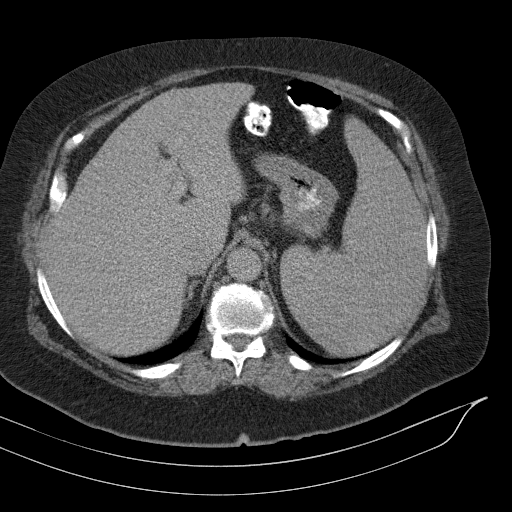
[im 4/58  bone]
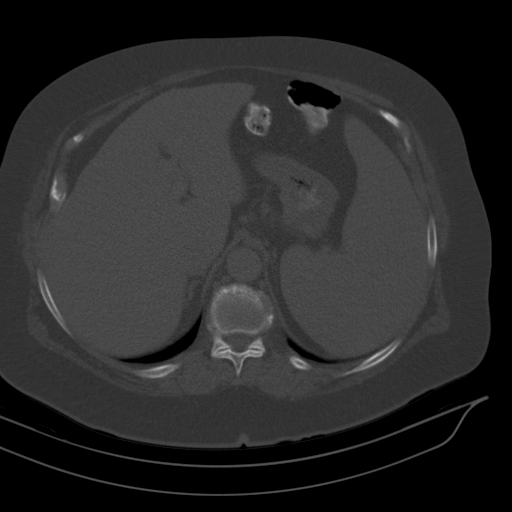
[im 8/58  soft-tissue]
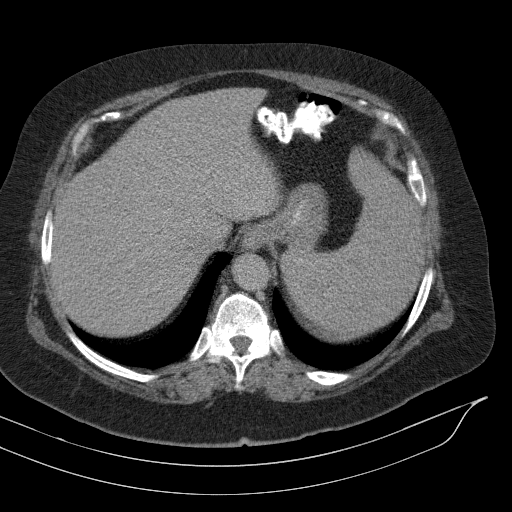
[im 13/58  soft-tissue]
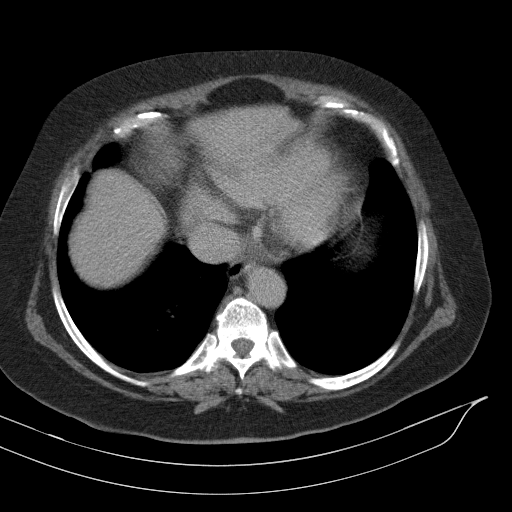
[im 17/58  soft-tissue]
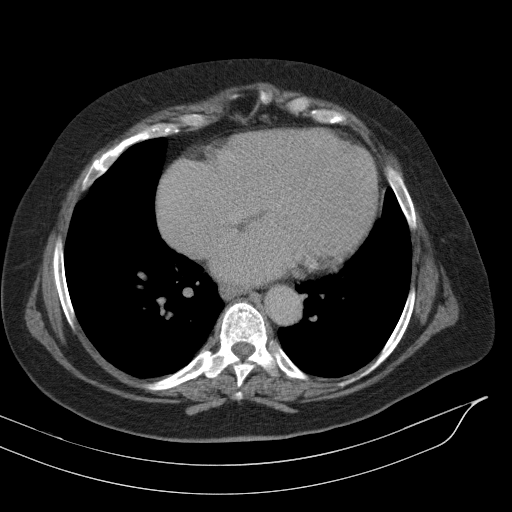
[im 23/58  soft-tissue]
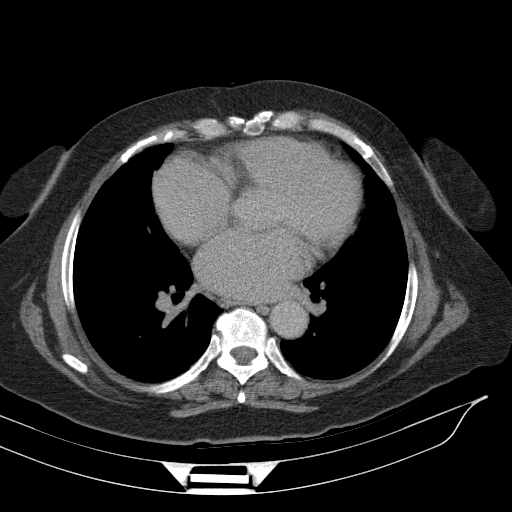
[im 26/58  soft-tissue]
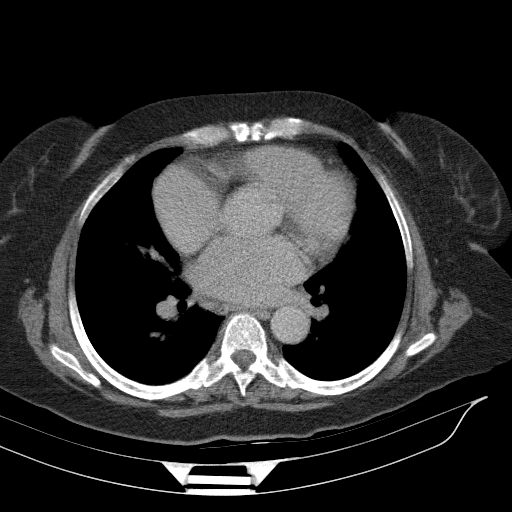
[im 32/58  soft-tissue]
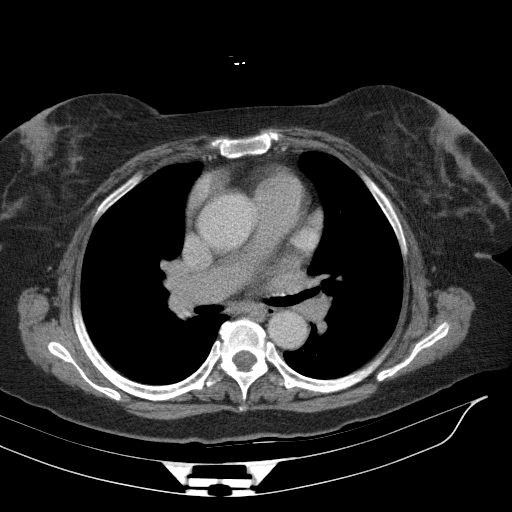
[im 35/58  soft-tissue]
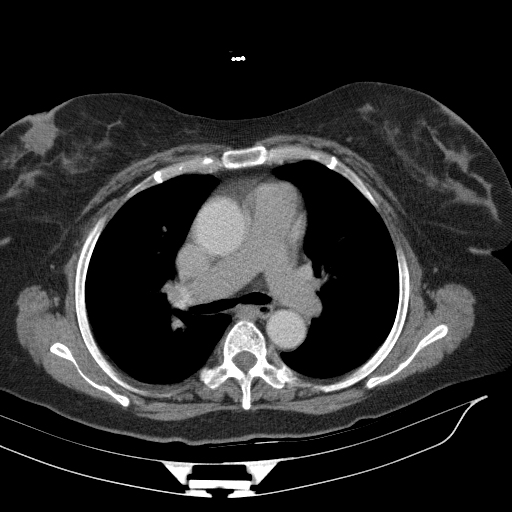
[im 41/58  soft-tissue]
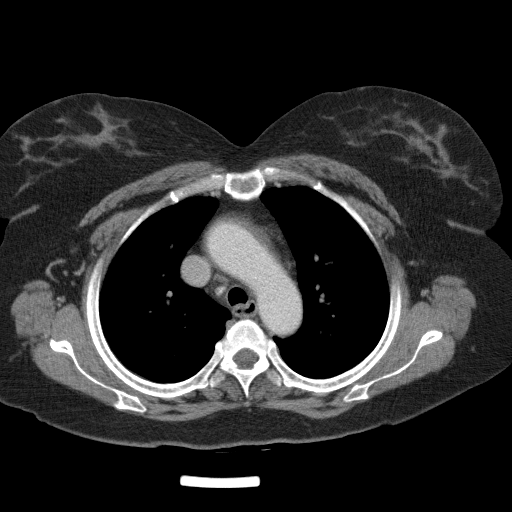
[im 41/58  bone]
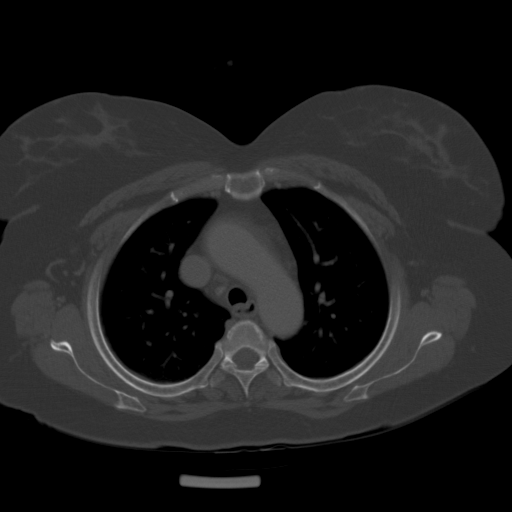
[im 45/58  soft-tissue]
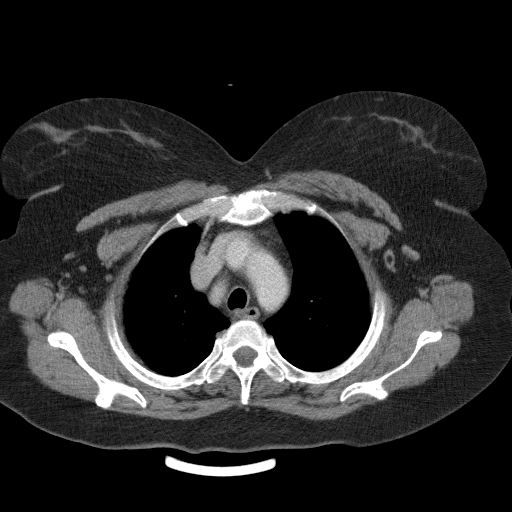
[im 50/58  soft-tissue]
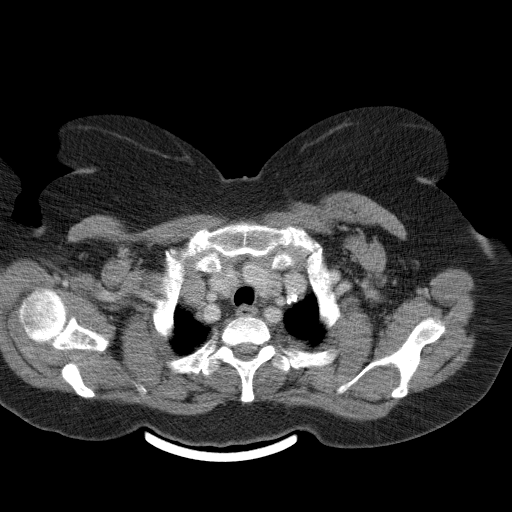
[im 50/58  lung]
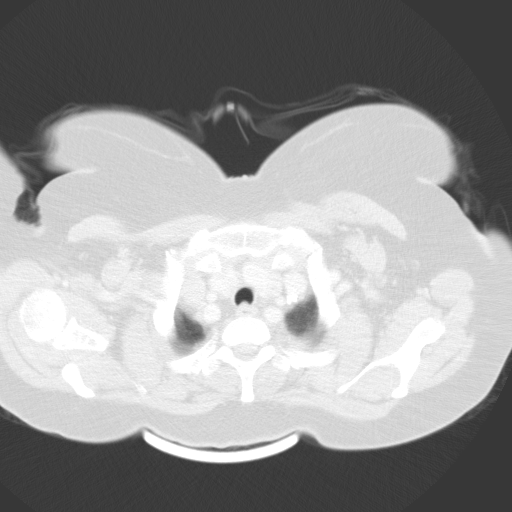
[im 52/58  lung]
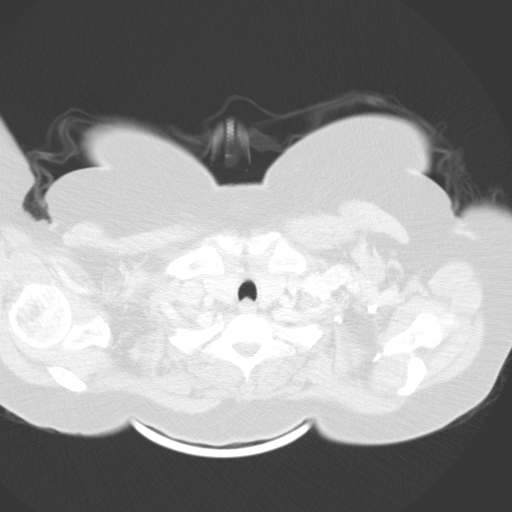
[im 54/58  soft-tissue]
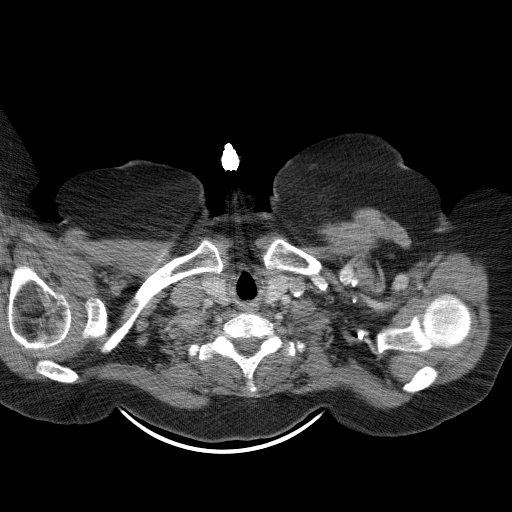
[im 54/58  lung]
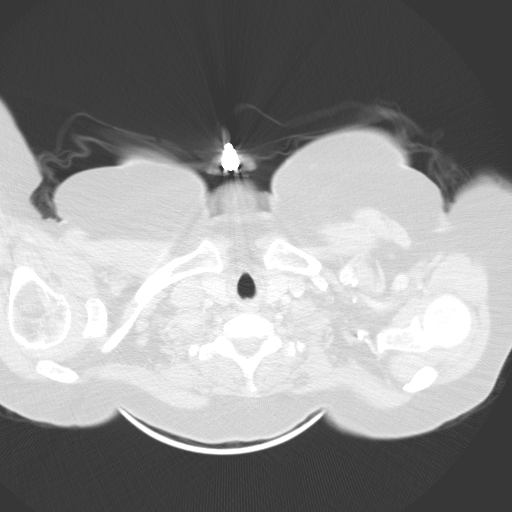
[im 56/58  lung]
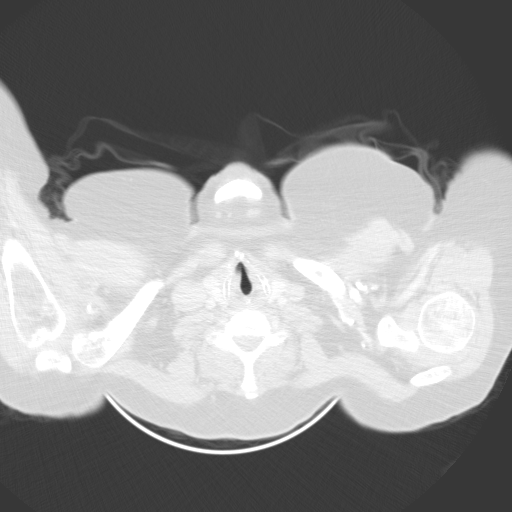

[14 of 32 positions shown; findings below may reference images not displayed]

EXAM
CT of the neck, chest, abdomen/pelvis with contrast

INDICATION
B-cell lymphoproliferative disorder

TECHNIQUE
All CT scans at this facility use dose modulation, iterative reconstruction, and/or weight based
dosing when appropriate to reduce radiation dose to as low as reasonably achievable.
CT of the neck, chest, abdomen/pelvis was performed with intravenous contrast.

COMPARISONS
None available at the time of dictation.

FINDINGS
No pathologically enlarged cervical lymphadenopathy. No prevertebral soft tissue swelling. Normal
vascular opacification within the neck. No abnormal organized fluid collection.

Normal symmetric caliber in appearance of the parotid and submandibular glands. There is a small
hypo attenuating nodule within the inferior aspect of the right thyroid lobe, incompletely
characterized and statistically most likely represents a small benign nodule.
Periapical lucency over a left maxillary residual molar tooth (Series 4, image 15). Mild amount of
mucosal thickening at the inferior aspect of the maxillary sinuses.

Small indeterminate nodule within the right lung base, which is favored to most likely benign
measuring 5 millimeters (series 1, image 45). Smaller subjacent nodule measuring 3 millimeters.
These 2 nodules are most likely postinflammatory/postinfectious in nature. No pleural effusion or
abnormal pleural thickening.

No abnormally enlarged mediastinal or axillary lymphadenopathy.

Mild cardiomegaly without a pericardial effusion. Normal caliber of the great vessels.

Small hepatic hypodensity within the left hepatic lobe (hepatic segment 2) measuring
centimeters (series 10, image 4). Significant splenomegaly measuring approximately 18.8 centimeters
in craniocaudal dimension (series 11, image 36).

The gallbladder, adrenal glands, pancreas are unremarkable.

Symmetric contrast enhancement of the kidneys without a suspicious focal lesion. Small hypodensity
within the anterior superior pole of the right kidney, too small to characterize. No
hydroureteronephrosis.

Normal bladder wall size without evidence of an intraluminal filling defect.

The pelvic organs are unremarkable. No visualized ascites.

No abnormal mesenteric or retroperitoneal, pelvic, or inguinal lymphadenopathy identified.

Degenerative change of the right temporomandibular joint with appearance of bone-on-bone contact.
The mandibular condyles appear to be anteriorly situated relative to their fossae in the open jaw
position, however correlate with chronic jaw pain (series 4, image 11). Mild reversal of the normal
cervical lordosis, which may be secondary to patient positioning.

IMPRESSION
Severe splenomegaly measuring approximately 18.8 centimeters in craniocaudal dimension.

No abnormal lymphadenopathy throughout the neck, chest, abdomen, or pelvis.

Likely postinfectious/postinflammatory nodules within the right lung base.

Tech Notes:

## 2020-09-07 IMAGING — CT Thorax^1_Chest_Abd_Pelvis_WITH (Adult)
1 series · 14 of 32 positions shown, 18 images · IV contrast (APPLIED)
Comparison: none

[Series 10: abd/pelvis 5.0 soft tissue · axial · 0.96mm/px · z∈[-666,-256]mm · 14 of 91 slices shown, 18 images]
[im 6/91  soft-tissue]
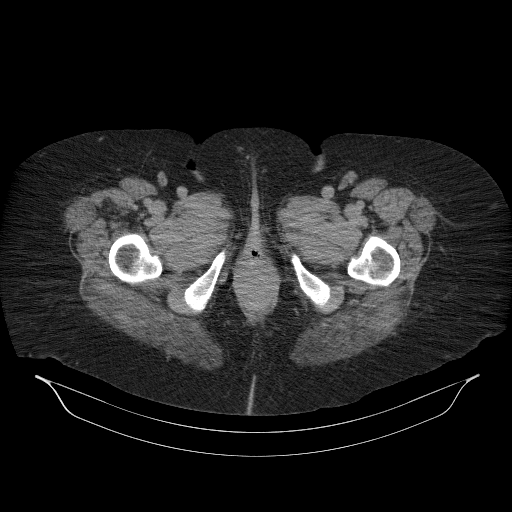
[im 6/91  bone]
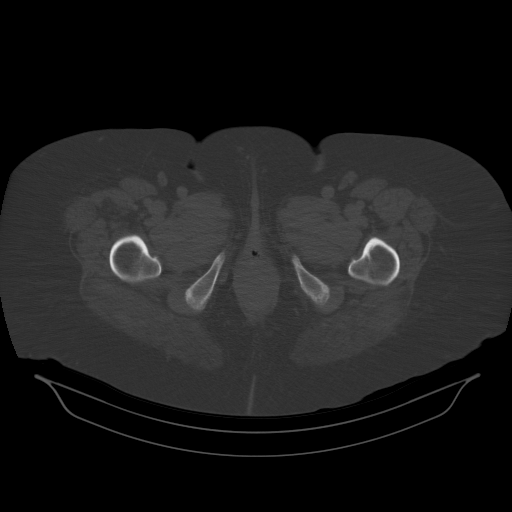
[im 12/91  soft-tissue]
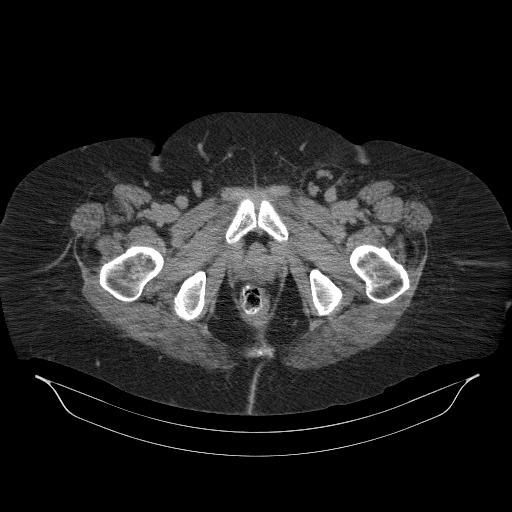
[im 21/91  soft-tissue]
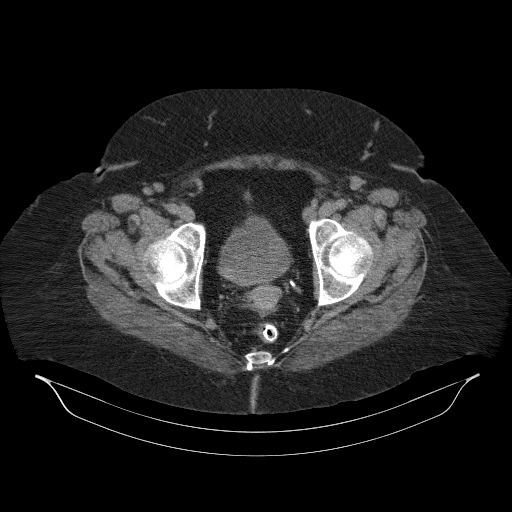
[im 27/91  soft-tissue]
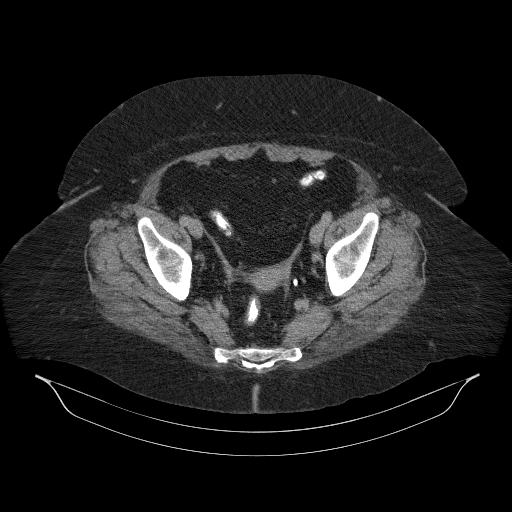
[im 35/91  soft-tissue]
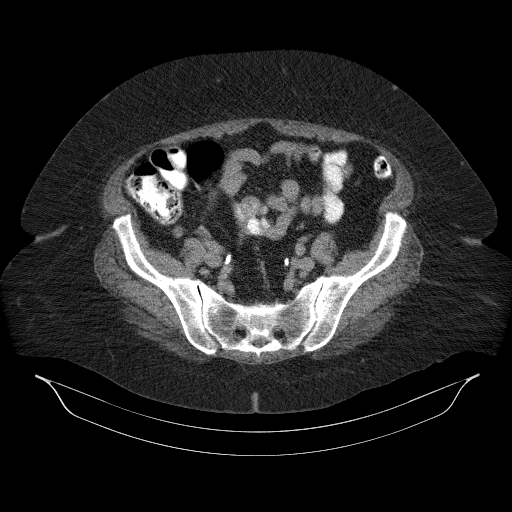
[im 41/91  soft-tissue]
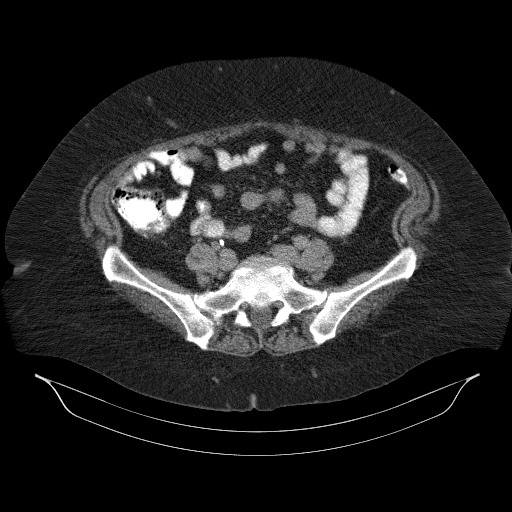
[im 50/91  soft-tissue]
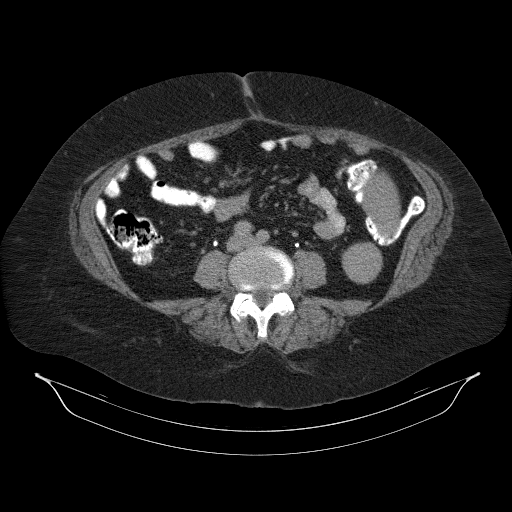
[im 56/91  soft-tissue]
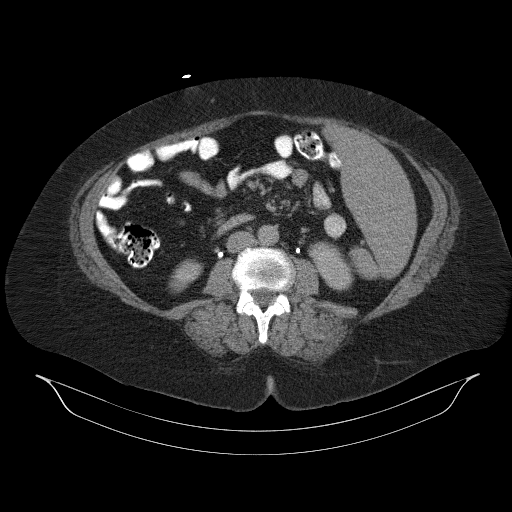
[im 64/91  soft-tissue]
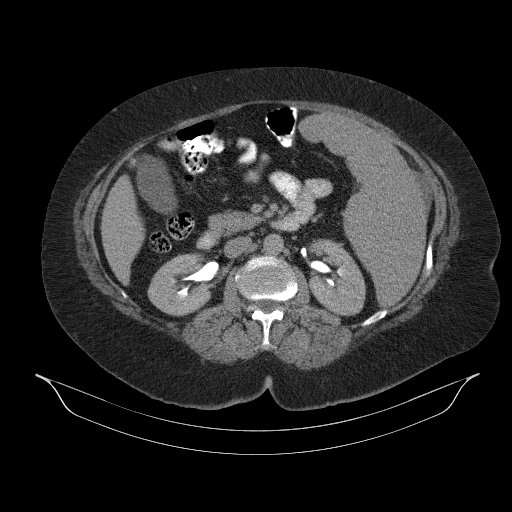
[im 64/91  bone]
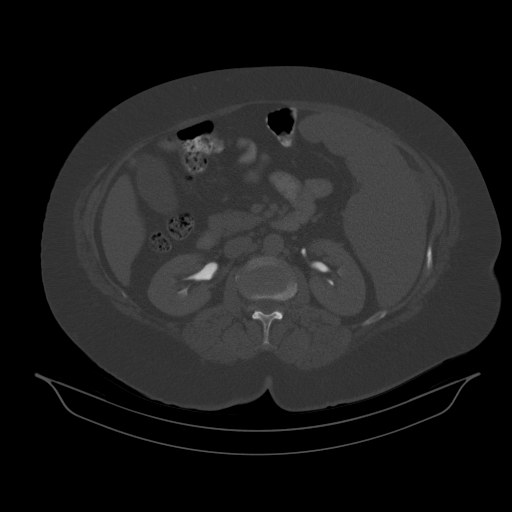
[im 70/91  soft-tissue]
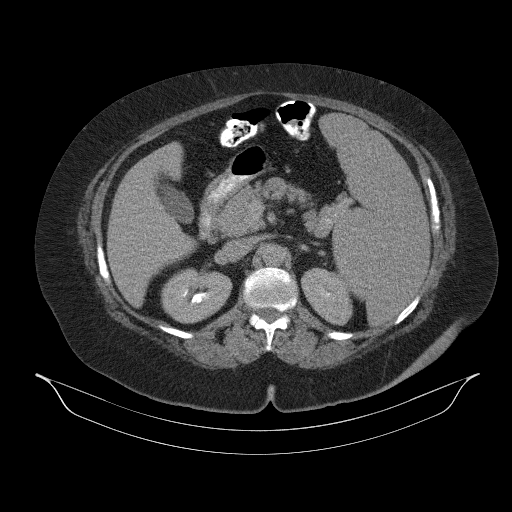
[im 79/91  soft-tissue]
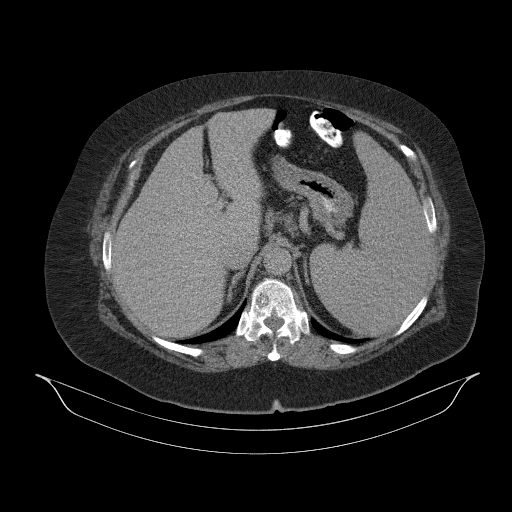
[im 79/91  lung]
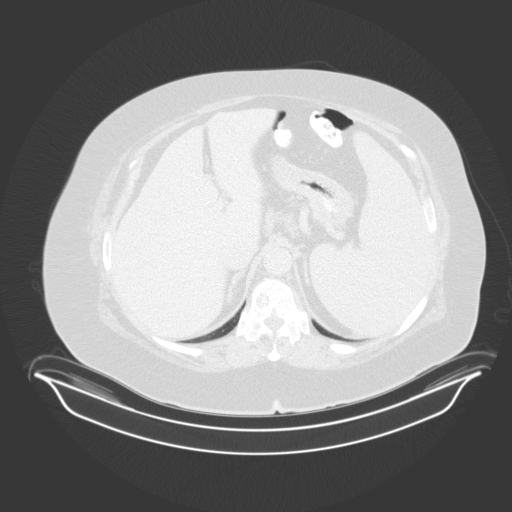
[im 82/91  lung]
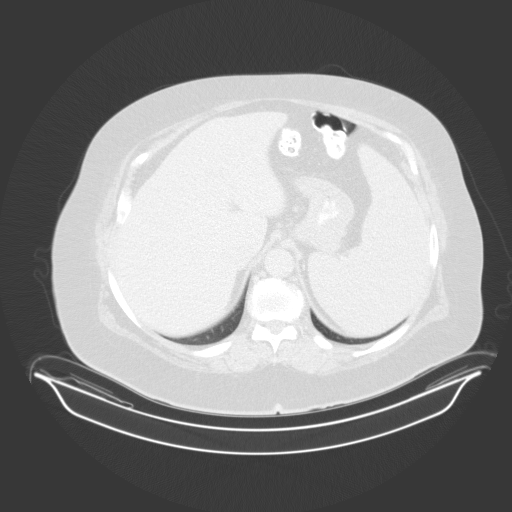
[im 85/91  soft-tissue]
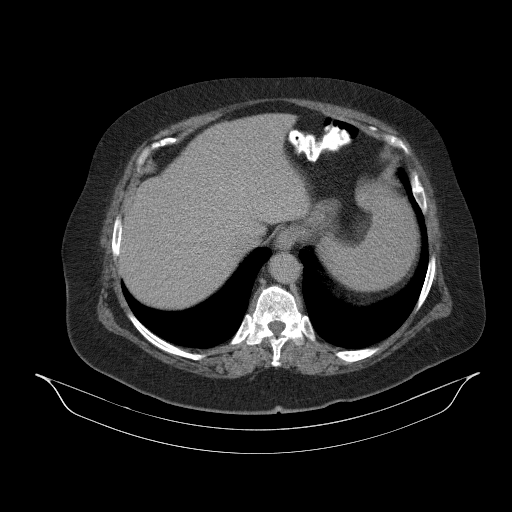
[im 85/91  lung]
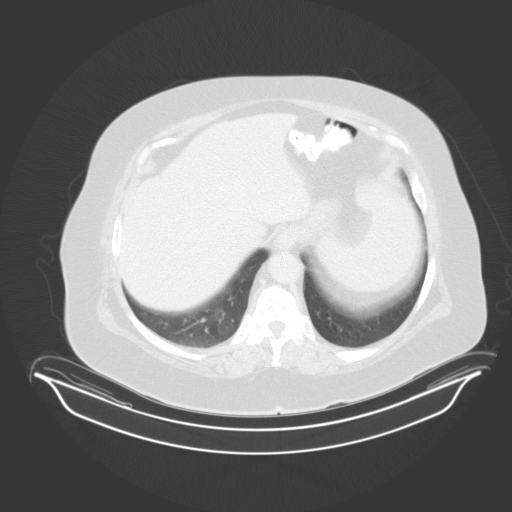
[im 88/91  lung]
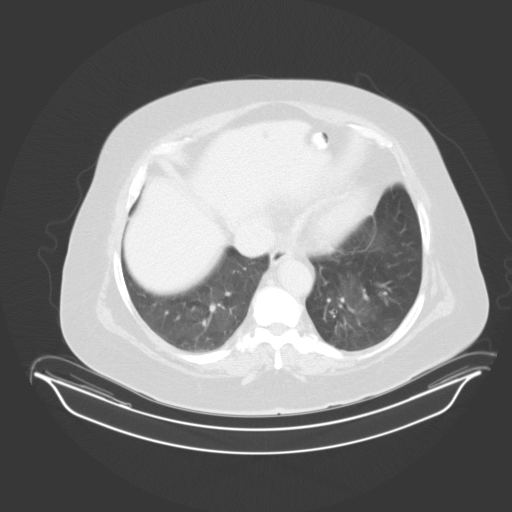

[14 of 32 positions shown; findings below may reference images not displayed]

EXAM
CT of the neck, chest, abdomen/pelvis with contrast

INDICATION
B-cell lymphoproliferative disorder

TECHNIQUE
All CT scans at this facility use dose modulation, iterative reconstruction, and/or weight based
dosing when appropriate to reduce radiation dose to as low as reasonably achievable.
CT of the neck, chest, abdomen/pelvis was performed with intravenous contrast.

COMPARISONS
None available at the time of dictation.

FINDINGS
No pathologically enlarged cervical lymphadenopathy. No prevertebral soft tissue swelling. Normal
vascular opacification within the neck. No abnormal organized fluid collection.

Normal symmetric caliber in appearance of the parotid and submandibular glands. There is a small
hypo attenuating nodule within the inferior aspect of the right thyroid lobe, incompletely
characterized and statistically most likely represents a small benign nodule.
Periapical lucency over a left maxillary residual molar tooth (Series 4, image 15). Mild amount of
mucosal thickening at the inferior aspect of the maxillary sinuses.

Small indeterminate nodule within the right lung base, which is favored to most likely benign
measuring 5 millimeters (series 1, image 45). Smaller subjacent nodule measuring 3 millimeters.
These 2 nodules are most likely postinflammatory/postinfectious in nature. No pleural effusion or
abnormal pleural thickening.

No abnormally enlarged mediastinal or axillary lymphadenopathy.

Mild cardiomegaly without a pericardial effusion. Normal caliber of the great vessels.

Small hepatic hypodensity within the left hepatic lobe (hepatic segment 2) measuring
centimeters (series 10, image 4). Significant splenomegaly measuring approximately 18.8 centimeters
in craniocaudal dimension (series 11, image 36).

The gallbladder, adrenal glands, pancreas are unremarkable.

Symmetric contrast enhancement of the kidneys without a suspicious focal lesion. Small hypodensity
within the anterior superior pole of the right kidney, too small to characterize. No
hydroureteronephrosis.

Normal bladder wall size without evidence of an intraluminal filling defect.

The pelvic organs are unremarkable. No visualized ascites.

No abnormal mesenteric or retroperitoneal, pelvic, or inguinal lymphadenopathy identified.

Degenerative change of the right temporomandibular joint with appearance of bone-on-bone contact.
The mandibular condyles appear to be anteriorly situated relative to their fossae in the open jaw
position, however correlate with chronic jaw pain (series 4, image 11). Mild reversal of the normal
cervical lordosis, which may be secondary to patient positioning.

IMPRESSION
Severe splenomegaly measuring approximately 18.8 centimeters in craniocaudal dimension.

No abnormal lymphadenopathy throughout the neck, chest, abdomen, or pelvis.

Likely postinfectious/postinflammatory nodules within the right lung base.

Tech Notes:

## 2020-09-07 IMAGING — CT Neck^1_SOFT_TISSUE_NECK_WITH (Adult)
1 series · 12 of 14 positions shown, 15 images · IV contrast (APPLIED)
Comparison: none

[Series 2: st neck 5.0 soft tissue · axial · 0.41mm/px · z∈[+33,+233]mm · 12 of 48 slices shown, 15 images]
[im 4/48  soft-tissue]
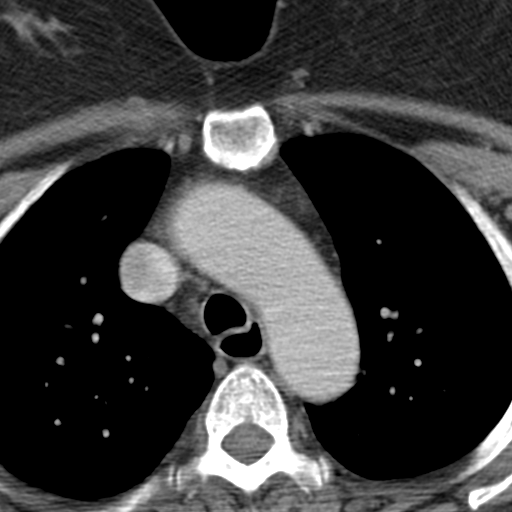
[im 4/48  bone]
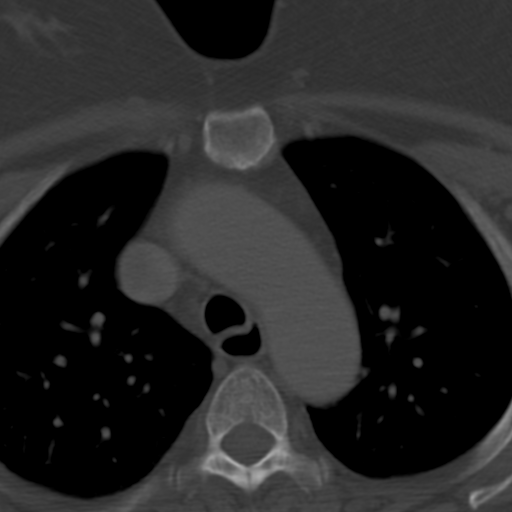
[im 8/48  bone]
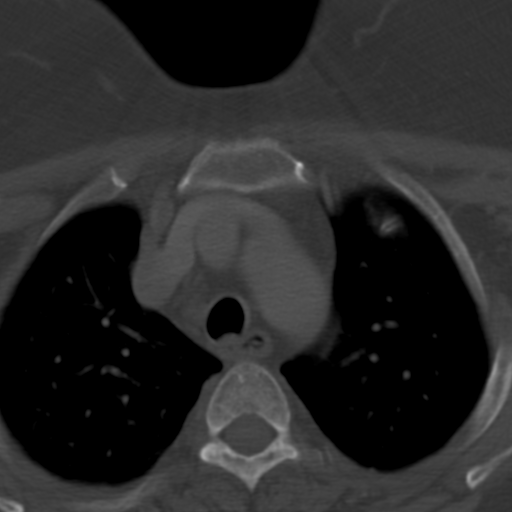
[im 11/48  bone]
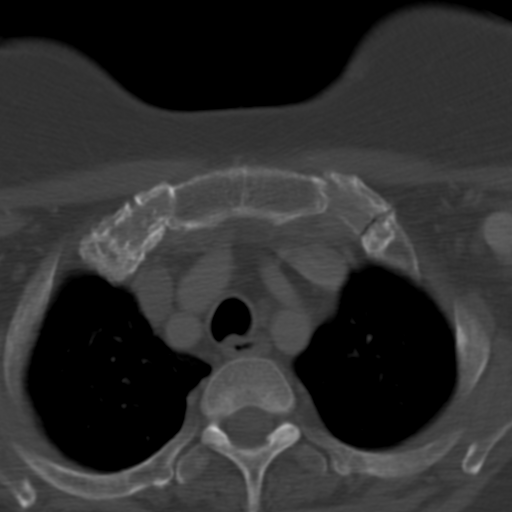
[im 15/48  bone]
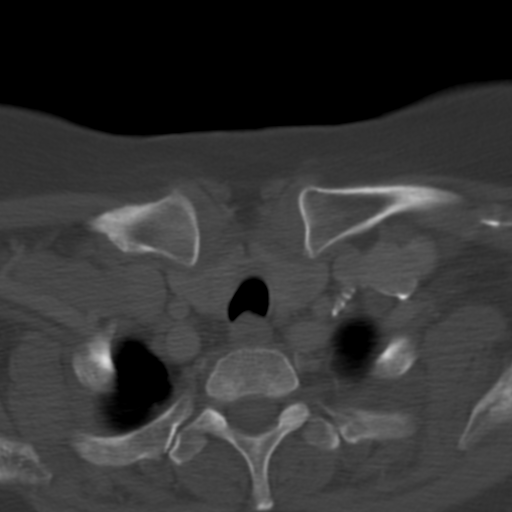
[im 19/48  soft-tissue]
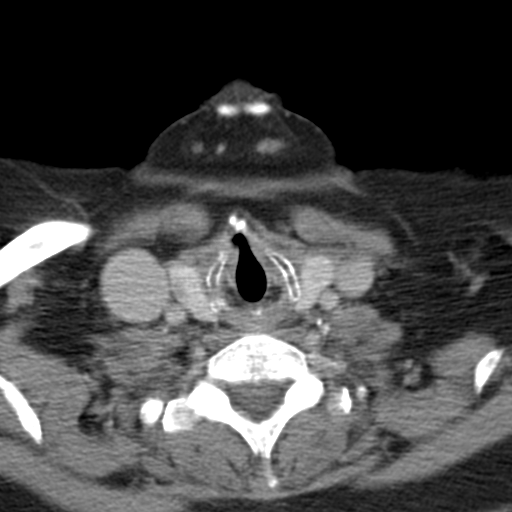
[im 19/48  bone]
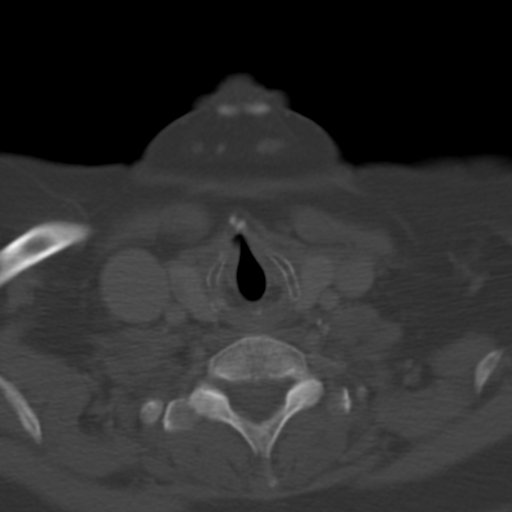
[im 22/48  bone]
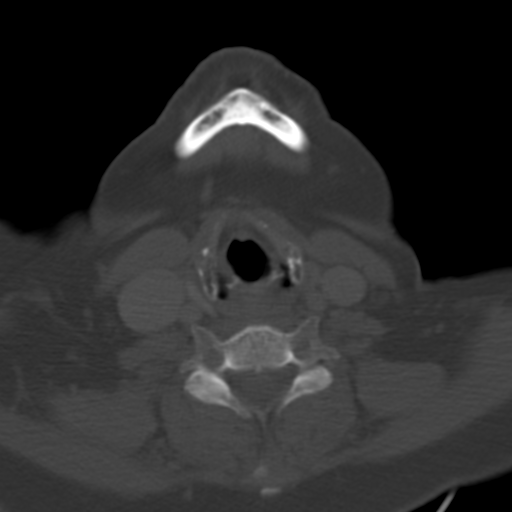
[im 26/48  bone]
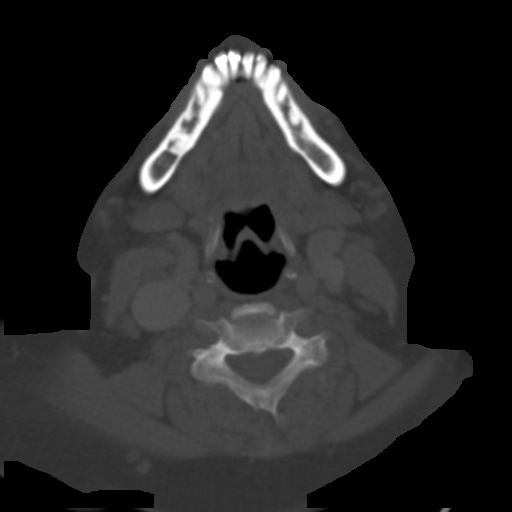
[im 29/48  bone]
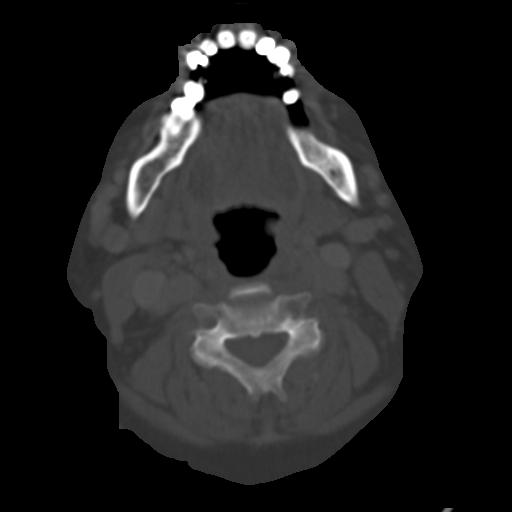
[im 33/48  soft-tissue]
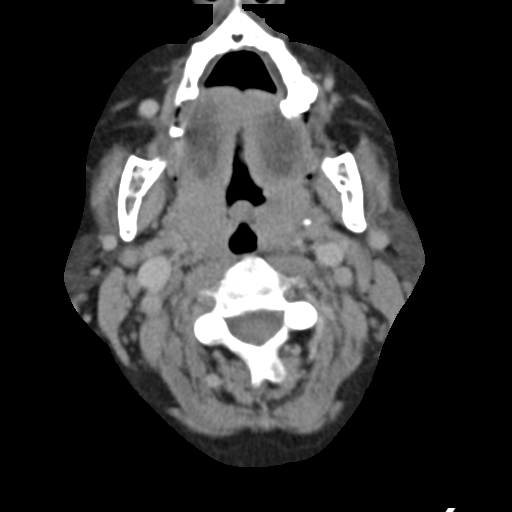
[im 33/48  bone]
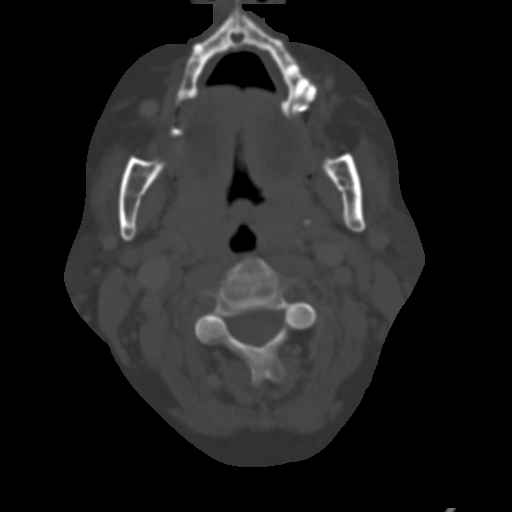
[im 37/48  bone]
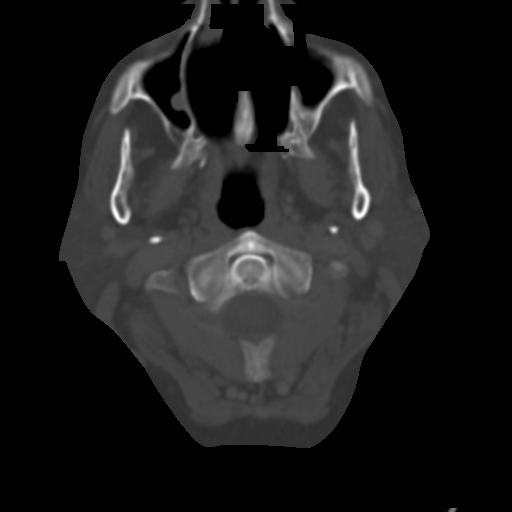
[im 40/48  bone]
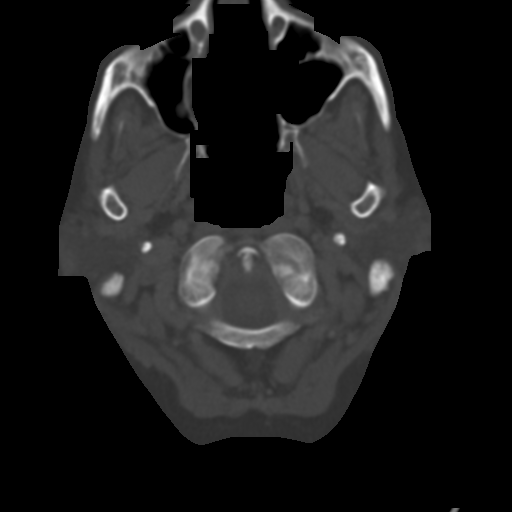
[im 44/48  bone]
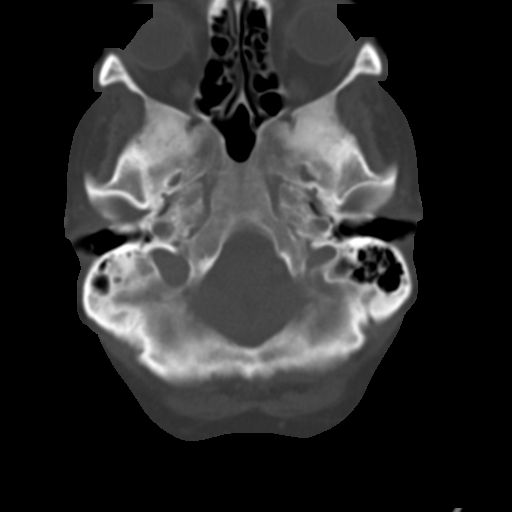

[12 of 14 positions shown; findings below may reference images not displayed]

EXAM
CT of the neck, chest, abdomen/pelvis with contrast

INDICATION
B-cell lymphoproliferative disorder

TECHNIQUE
All CT scans at this facility use dose modulation, iterative reconstruction, and/or weight based
dosing when appropriate to reduce radiation dose to as low as reasonably achievable.
CT of the neck, chest, abdomen/pelvis was performed with intravenous contrast.

COMPARISONS
None available at the time of dictation.

FINDINGS
No pathologically enlarged cervical lymphadenopathy. No prevertebral soft tissue swelling. Normal
vascular opacification within the neck. No abnormal organized fluid collection.

Normal symmetric caliber in appearance of the parotid and submandibular glands. There is a small
hypo attenuating nodule within the inferior aspect of the right thyroid lobe, incompletely
characterized and statistically most likely represents a small benign nodule.
Periapical lucency over a left maxillary residual molar tooth (Series 4, image 15). Mild amount of
mucosal thickening at the inferior aspect of the maxillary sinuses.

Small indeterminate nodule within the right lung base, which is favored to most likely benign
measuring 5 millimeters (series 1, image 45). Smaller subjacent nodule measuring 3 millimeters.
These 2 nodules are most likely postinflammatory/postinfectious in nature. No pleural effusion or
abnormal pleural thickening.

No abnormally enlarged mediastinal or axillary lymphadenopathy.

Mild cardiomegaly without a pericardial effusion. Normal caliber of the great vessels.

Small hepatic hypodensity within the left hepatic lobe (hepatic segment 2) measuring
centimeters (series 10, image 4). Significant splenomegaly measuring approximately 18.8 centimeters
in craniocaudal dimension (series 11, image 36).

The gallbladder, adrenal glands, pancreas are unremarkable.

Symmetric contrast enhancement of the kidneys without a suspicious focal lesion. Small hypodensity
within the anterior superior pole of the right kidney, too small to characterize. No
hydroureteronephrosis.

Normal bladder wall size without evidence of an intraluminal filling defect.

The pelvic organs are unremarkable. No visualized ascites.

No abnormal mesenteric or retroperitoneal, pelvic, or inguinal lymphadenopathy identified.

Degenerative change of the right temporomandibular joint with appearance of bone-on-bone contact.
The mandibular condyles appear to be anteriorly situated relative to their fossae in the open jaw
position, however correlate with chronic jaw pain (series 4, image 11). Mild reversal of the normal
cervical lordosis, which may be secondary to patient positioning.

IMPRESSION
Severe splenomegaly measuring approximately 18.8 centimeters in craniocaudal dimension.

No abnormal lymphadenopathy throughout the neck, chest, abdomen, or pelvis.

Likely postinfectious/postinflammatory nodules within the right lung base.

Tech Notes:

## 2020-11-21 ENCOUNTER — Encounter: Admit: 2020-11-21 | Discharge: 2020-11-21 | Payer: MEDICARE

## 2020-11-26 ENCOUNTER — Encounter: Admit: 2020-11-26 | Discharge: 2020-11-26 | Payer: MEDICARE

## 2020-11-26 NOTE — Progress Notes
Contacted patient's sister, "Pamala Hurry", by phone on 11/26/2020 regarding lab ordered for Lipid Panel per Dr. Arlester Marker Love that patient has not completed. Patient's sister stated she will take patient to [facility] to have lab drawn before patient's upcoming appointment with [facility] on 12/03/2020. Faxed lab order to [facility] ([place]: [phone]). Notified patient's sister of fasting instructions for patient. Patient's sister voiced understanding.

## 2020-11-29 ENCOUNTER — Encounter: Admit: 2020-11-29 | Discharge: 2020-11-29 | Payer: MEDICARE

## 2020-11-29 DIAGNOSIS — I4821 Permanent atrial fibrillation: Secondary | ICD-10-CM

## 2020-11-29 DIAGNOSIS — I5020 Unspecified systolic (congestive) heart failure: Secondary | ICD-10-CM

## 2020-11-29 DIAGNOSIS — I428 Other cardiomyopathies: Secondary | ICD-10-CM

## 2020-11-29 DIAGNOSIS — I10 Essential (primary) hypertension: Secondary | ICD-10-CM

## 2020-11-29 LAB — LIPID PROFILE
CHOLESTEROL: 142
HDL: 39 — ABNORMAL LOW
LDL: 87
TRIGLYCERIDES: 82
VLDL: 16

## 2020-11-29 MED ORDER — METOPROLOL SUCCINATE 50 MG PO TB24
ORAL_TABLET | Freq: Every day | ORAL | 3 refills | 90.00000 days | Status: AC
Start: 2020-11-29 — End: ?

## 2020-12-03 ENCOUNTER — Encounter: Admit: 2020-12-03 | Discharge: 2020-12-03 | Payer: MEDICARE

## 2020-12-03 DIAGNOSIS — S301XXA Contusion of abdominal wall, initial encounter: Secondary | ICD-10-CM

## 2020-12-03 DIAGNOSIS — N390 Urinary tract infection, site not specified: Secondary | ICD-10-CM

## 2020-12-03 DIAGNOSIS — I4891 Unspecified atrial fibrillation: Secondary | ICD-10-CM

## 2020-12-03 DIAGNOSIS — D649 Anemia, unspecified: Secondary | ICD-10-CM

## 2020-12-03 DIAGNOSIS — I10 Essential (primary) hypertension: Secondary | ICD-10-CM

## 2020-12-03 DIAGNOSIS — F79 Unspecified intellectual disabilities: Secondary | ICD-10-CM

## 2020-12-03 DIAGNOSIS — C8590 Non-Hodgkin lymphoma, unspecified, unspecified site: Secondary | ICD-10-CM

## 2020-12-03 NOTE — Patient Instructions
Follow up as directed.  Call sooner if issues.  Call the [facility] nursing line at [phone].  Leave a detailed message for the nurse in [facility] with how we can assist you and we will call you back.

## 2020-12-28 ENCOUNTER — Encounter: Admit: 2020-12-28 | Discharge: 2020-12-28 | Payer: MEDICARE

## 2020-12-28 MED ORDER — SPIRONOLACTONE 50 MG PO TAB
ORAL_TABLET | Freq: Every day | 1 refills
Start: 2020-12-28 — End: ?

## 2021-01-29 ENCOUNTER — Encounter: Admit: 2021-01-29 | Discharge: 2021-01-29 | Payer: MEDICARE

## 2021-01-29 MED ORDER — XARELTO 20 MG PO TAB
ORAL_TABLET | Freq: Every day | ORAL | 3 refills | 30.00000 days | Status: AC
Start: 2021-01-29 — End: ?

## 2021-05-23 ENCOUNTER — Encounter: Admit: 2021-05-23 | Discharge: 2021-05-23 | Payer: MEDICARE

## 2021-05-23 MED ORDER — SPIRONOLACTONE 50 MG PO TAB
ORAL_TABLET | Freq: Every day | ORAL | 1 refills | 90.00000 days | Status: AC
Start: 2021-05-23 — End: ?

## 2021-07-30 ENCOUNTER — Encounter: Admit: 2021-07-30 | Discharge: 2021-07-30 | Payer: MEDICARE

## 2021-07-30 MED ORDER — LOSARTAN 50 MG PO TAB
ORAL_TABLET | 3 refills
Start: 2021-07-30 — End: ?

## 2021-10-29 ENCOUNTER — Encounter: Admit: 2021-10-29 | Discharge: 2021-10-29 | Payer: MEDICARE

## 2021-10-29 MED ORDER — SPIRONOLACTONE 50 MG PO TAB
ORAL_TABLET | 1 refills
Start: 2021-10-29 — End: ?

## 2021-10-29 MED ORDER — METOPROLOL SUCCINATE 50 MG PO TB24
ORAL_TABLET | 3 refills
Start: 2021-10-29 — End: ?

## 2021-10-30 ENCOUNTER — Encounter: Admit: 2021-10-30 | Discharge: 2021-10-30 | Payer: MEDICARE

## 2021-12-29 ENCOUNTER — Encounter: Admit: 2021-12-29 | Discharge: 2021-12-29 | Payer: MEDICARE

## 2021-12-29 MED ORDER — LOSARTAN 50 MG PO TAB
ORAL_TABLET | ORAL | 0 refills | 90.00000 days | Status: AC
Start: 2021-12-29 — End: ?

## 2021-12-29 MED ORDER — XARELTO 20 MG PO TAB
ORAL_TABLET | ORAL | 0 refills | 30.00000 days | Status: AC
Start: 2021-12-29 — End: ?

## 2021-12-29 MED ORDER — METOPROLOL SUCCINATE 50 MG PO TB24
ORAL_TABLET | ORAL | 0 refills | 90.00000 days | Status: AC
Start: 2021-12-29 — End: ?

## 2022-01-01 ENCOUNTER — Encounter: Admit: 2022-01-01 | Discharge: 2022-01-01 | Payer: MEDICARE

## 2022-01-01 MED ORDER — SPIRONOLACTONE 50 MG PO TAB
50 mg | ORAL_TABLET | Freq: Every day | ORAL | 0 refills | 90.00000 days | Status: AC
Start: 2022-01-01 — End: ?

## 2022-03-19 ENCOUNTER — Encounter: Admit: 2022-03-19 | Discharge: 2022-03-19 | Payer: MEDICARE

## 2022-03-19 MED ORDER — XARELTO 20 MG PO TAB
ORAL_TABLET | ORAL | 0 refills | 30.00000 days | Status: AC
Start: 2022-03-19 — End: ?

## 2022-03-19 MED ORDER — METOPROLOL SUCCINATE 50 MG PO TB24
ORAL_TABLET | ORAL | 0 refills | 90.00000 days | Status: AC
Start: 2022-03-19 — End: ?

## 2022-03-19 MED ORDER — LOSARTAN 50 MG PO TAB
ORAL_TABLET | ORAL | 0 refills | 90.00000 days | Status: AC
Start: 2022-03-19 — End: ?

## 2022-03-19 MED ORDER — SPIRONOLACTONE 50 MG PO TAB
ORAL_TABLET | ORAL | 0 refills | 90.00000 days | Status: AC
Start: 2022-03-19 — End: ?

## 2022-05-19 ENCOUNTER — Encounter: Admit: 2022-05-19 | Discharge: 2022-05-19 | Payer: MEDICARE

## 2022-05-19 MED ORDER — SPIRONOLACTONE 50 MG PO TAB
ORAL_TABLET | ORAL | 0 refills | 90.00000 days | Status: AC
Start: 2022-05-19 — End: ?

## 2022-05-19 MED ORDER — METOPROLOL SUCCINATE 50 MG PO TB24
ORAL_TABLET | ORAL | 0 refills | 90.00000 days | Status: AC
Start: 2022-05-19 — End: ?

## 2022-05-19 MED ORDER — XARELTO 20 MG PO TAB
ORAL_TABLET | ORAL | 0 refills | 30.00000 days | Status: AC
Start: 2022-05-19 — End: ?

## 2022-05-19 MED ORDER — LOSARTAN 50 MG PO TAB
ORAL_TABLET | ORAL | 0 refills | 90.00000 days | Status: AC
Start: 2022-05-19 — End: ?

## 2022-05-19 NOTE — Telephone Encounter
Pt scheduled for OV with [facility] 06/2022. Labs available in [place]

## 2022-07-13 ENCOUNTER — Encounter: Admit: 2022-07-13 | Discharge: 2022-07-13 | Payer: MEDICARE

## 2022-07-13 MED ORDER — SPIRONOLACTONE 50 MG PO TAB
ORAL_TABLET | 0 refills
Start: 2022-07-13 — End: ?

## 2022-07-13 MED ORDER — METOPROLOL SUCCINATE 50 MG PO TB24
ORAL_TABLET | 0 refills
Start: 2022-07-13 — End: ?

## 2022-07-13 MED ORDER — LOSARTAN 50 MG PO TAB
ORAL_TABLET | 0 refills
Start: 2022-07-13 — End: ?

## 2022-07-13 MED ORDER — XARELTO 20 MG PO TAB
ORAL_TABLET | 0 refills
Start: 2022-07-13 — End: ?

## 2022-07-14 ENCOUNTER — Encounter: Admit: 2022-07-14 | Discharge: 2022-07-14 | Payer: MEDICARE

## 2022-07-14 DIAGNOSIS — C8590 Non-Hodgkin lymphoma, unspecified, unspecified site: Secondary | ICD-10-CM

## 2022-07-14 DIAGNOSIS — F79 Unspecified intellectual disabilities: Secondary | ICD-10-CM

## 2022-07-14 DIAGNOSIS — I10 Essential (primary) hypertension: Secondary | ICD-10-CM

## 2022-07-14 DIAGNOSIS — D649 Anemia, unspecified: Secondary | ICD-10-CM

## 2022-07-14 DIAGNOSIS — I4821 Permanent atrial fibrillation: Secondary | ICD-10-CM

## 2022-07-14 DIAGNOSIS — Z136 Encounter for screening for cardiovascular disorders: Secondary | ICD-10-CM

## 2022-07-14 DIAGNOSIS — N390 Urinary tract infection, site not specified: Secondary | ICD-10-CM

## 2022-07-14 DIAGNOSIS — I5020 Unspecified systolic (congestive) heart failure: Secondary | ICD-10-CM

## 2022-07-14 DIAGNOSIS — S301XXA Contusion of abdominal wall, initial encounter: Secondary | ICD-10-CM

## 2022-07-14 DIAGNOSIS — I4891 Unspecified atrial fibrillation: Secondary | ICD-10-CM

## 2022-07-14 MED ORDER — LOSARTAN 50 MG PO TAB
75 mg | ORAL_TABLET | Freq: Every day | ORAL | 0 refills | 90.00000 days | Status: AC
Start: 2022-07-14 — End: ?

## 2022-07-14 NOTE — Progress Notes
Date of Service: 07/14/2022    Denise Boyd is a 67 y.o. female.       Chief Complaint: Follow-up    History of Present Illness:     I had the pleasure of seeing Denise Boyd in our [place] office this afternoon for cardiovascular followup.      As you know, Denise Boyd is a delightful 67 year old female with history of developmental/cognitive delay.  She is here today with her sister, who helps manage a lot of her medical care.  Denise Boyd is currently living in her own apartment at an assisted living facility.  She has a history of atrial fibrillation with prior ablation procedures in the past.  Given difficulty maintaining sinus rhythm, permanent atrial fibrillation has ultimately been accepted.  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  More recently, it was 45% on a transthoracic echocardiogram completed July 24, 2020.  Additional comorbidities include hypertension and morbid obesity.      Since our last visit, Denise Boyd has really gotten along quite well.  The only issue she has had was a fall that she took about a week ago.  Her walker broke into and she fell to the ground.  She has pretty large bruises on both arms.  It sounds like she is healing up okay though.  Hemoglobin has been stable.  Otherwise, she is not having any symptomatic concerns today.  No chest pain or shortness of breath.  No lightheadedness, dizziness, palpitations, orthopnea, paroxysmal nocturnal dyspnea.  She does have some lower extremity swelling which is nonpitting and overall she feels is pretty stable for her.  She is taking her medications without issue.  She is on rivaroxaban.  Has no troubles with bleeding.       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 Cardiac catheterization 10/2015 revealed no CAD.      A-fib (HCC) 12/26/2015    Systolic heart failure (HCC) 11/07/2015    Abnormal echocardiogram 11/07/2015    Iron deficiency anemia 07/26/2015    Hypertension     Atrial fibrillation (HCC)      05-27-16: [facility] OV: Started on Coumadin 5 mg for new onset of A-Fib, then taken off and put on baby aspirin. Referred to cardiology for eval.  11/15/15: Cardiac cath: normal coronary angiography, normal LVEDP      Mentally challenged          Review of Systems   Constitutional: Negative.   HENT: Negative.     Eyes: Negative.    Cardiovascular: Negative.    Respiratory: Negative.     Endocrine: Negative.    Hematologic/Lymphatic: Negative.  Skin: Negative.    Musculoskeletal: Negative.    Gastrointestinal: Negative.    Genitourinary: Negative.    Neurological: Negative.    Psychiatric/Behavioral: Negative.     Allergic/Immunologic: Negative.    All other systems reviewed and are negative.      Vitals:    07/14/22 1039   BP: (!) 153/108   BP Source: Arm, Right Upper   Pulse: 83   SpO2: 96%   O2 Device: None (Room air)   PainSc: Zero   Weight: (!) 150.1 kg (331 lb)   Height: 177.8 cm (5' 10)     Body mass index is 47.49 kg/m?Marland Kitchen    Physical Examination:  General Appearance: No acute distress. Fully alert and oriented.  Skin: Warm. No ulcers or xanthomas.   HEENT: Grossly unremarkable. Lips and oral mucosa without pallor or cyanosis. Moist mucous membranes.   Neck Veins: Normal jugular venous pressure. Neck veins are not distended.  Carotid Arteries: Normal carotid upstroke bilaterally. No bruits.  Chest Inspection: Midline scar consistent with prior sternotomy Chest is normal in appearance.  Auscultation/Percussion: Normal respiratory effort. Lungs clear to auscultation bilaterally. No wheezes, rales, or rhonchi.    Cardiac Rhythm: Regular rhythm. Normal rate.  Cardiac Auscultation: Normal S1 & S2. No S3 or S4. No rub.  Murmurs: No cardiac murmurs.  Peripheral Circulation: Normal peripheral circulation.   Abdominal Aorta: No abdominal aortic bruit.  Extremities: Appropriately warm to touch.  Mild lower extremity edema.  Abdominal Exam: Soft, non-tender. No masses, no organomegaly. Normal bowel sounds.  Neurologic Exam: Neurological assessment grossly intact.       Assessment and Plan:  Permanent atrial fibrillation:  Denise Boyd has had multiple prior ablation procedures in the past, which unfortunately have been unsuccessful.  Permanent atrial fibrillation has now been accepted.  She is on a rate control strategy with metoprolol, which seems to be working well.  Stroke prophylaxis with rivaroxaban.  No changes to her current regimen today.      Nonischemic cardiomyopathy:  Denise Boyd has a history of heart failure with reduced ejection fraction.  This is felt to be nonischemic in nature.  Her EF was as low as 25% in 2017.  With better atrial fibrillation control, she has had a rather significant improvement in her left ventricular systolic function.  Her most recent echocardiogram from March 2022 demonstrated left ventricular ejection fraction of 45%.  She is on excellent guideline-directed medical therapies with metoprolol-XL 50 mg daily, losartan 75 mg daily and spironolactone 50 mg daily.  Unfortunately due to multiple urinary tract infections in the past she is not really a great candidate for SGLT2 inhibitor.  We are going to continue her current regimen.  She is currently asymptomatic.  She is due for repeat echocardiogram to reassess left ventricular systolic function.     Hypertension:  Very well controlled.  Blood pressure is elevated in the office today.  Recommend gently increase her losartan from 50 to 75 mg daily.  Repeat basic metabolic panel in a week.       Morbid obesity:  She would benefit from dietary modifications and focus on Mediterranean diet.  I also encouraged her to get more regular exercise.  Simply walking 10 to 15 minutes a day would be great, but she is rather significantly limited due to her arthritis.      It was a pleasure seeing Denise Boyd back in the office today and I appreciate the opportunity to take part in her care.  I look forward to  seeing her back in about a year.  If I can be of any assistance in the interim, please do not hesitate to reach out with questions or concerns.         Total time spent on today's office visit was 35 minutes. This includes face-to-face in person visit with patient as well as non face-to-face time including review of the electronic medical record, outside records, labs, radiologic studies, cardiovascular studies, formulation of treatment plan, after visit summary, future disposition, personal discussions, and documentation.    Current Medications (including today's revisions)   cetirizine (ZYRTEC) 10 mg tablet Take one tablet by mouth as Needed for Allergy symptoms.    citalopram (CELEXA) 10 mg tablet Take one tablet by mouth daily.    docusate (COLACE) 100 mg capsule Take one capsule by mouth daily as needed for Constipation.    estradioL (ESTRACE) 0.01 % (0.1 mg/g) vaginal cream Insert or Apply one g to vaginal area every 7 days.    ferrous sulfate (FEOSOL, FEROSUL) 325 mg (65 mg iron) tablet Take one tablet by mouth twice daily. Take on an empty stomach at least 1 hour before or 2 hours after food.    fluticasone propionate (FLONASE) 50 mcg/actuation nasal spray, suspension Apply one spray into nose as directed daily.    losartan (COZAAR) 50 mg tablet TAKE 1 TABLET BY MOUTH EVERY MORNING FOR HIGH BLOOD PRESSURE/POTASSIUM REPLACEMENT (EQ: COZAAR)    metoprolol succinate XL (TOPROL XL) 50 mg extended release tablet TAKE 1 TABLET BY MOUTH ONCE A DAY (IN PM) FOR CHEST PAINS (EQ: TOPROL XL) *DO NOT CRUSH*    senna (SENOKOT) 8.6 mg tablet Take one tablet by mouth at bedtime daily.    spironolactone (ALDACTONE) 50 mg tablet TAKE 1 TABLET BY MOUTH DAILY WITH FOOD FOR HIGH BLOOD PRESSURE/WATER RETENTION (EQ ALDACTONE)    XARELTO 20 mg tablet TAKE 1 TABLET BY MOUTH DAILY WITH BREAKFAST FOR BLOOD THINNER (RIVAROXABAN) *TAKE WITH FOOD*

## 2022-07-14 NOTE — Patient Instructions
Increase losartan to 75 mg daily  Echo to be done in [place]  Follow up in 1 year    Follow up as directed.  Call sooner if issues.  Call the [facility] line at [phone].  Leave a detailed message for the nurse in [facility] [facility] with how we can assist you and we will call you back.

## 2022-07-29 ENCOUNTER — Encounter: Admit: 2022-07-29 | Discharge: 2022-07-29 | Payer: MEDICARE

## 2022-07-29 ENCOUNTER — Ambulatory Visit: Admit: 2022-07-29 | Discharge: 2022-07-29 | Payer: MEDICARE

## 2022-07-29 DIAGNOSIS — I10 Essential (primary) hypertension: Secondary | ICD-10-CM

## 2022-09-09 ENCOUNTER — Encounter: Admit: 2022-09-09 | Discharge: 2022-09-09 | Payer: MEDICARE

## 2022-09-09 MED ORDER — LOSARTAN 25 MG PO TAB
ORAL_TABLET | ORAL | 3 refills | 90.00000 days | Status: AC
Start: 2022-09-09 — End: ?

## 2022-09-10 ENCOUNTER — Encounter: Admit: 2022-09-10 | Discharge: 2022-09-10 | Payer: MEDICARE

## 2022-09-10 MED ORDER — LOSARTAN 50 MG PO TAB
ORAL_TABLET | ORAL | 3 refills | 90.00000 days | Status: AC
Start: 2022-09-10 — End: ?

## 2022-10-06 ENCOUNTER — Encounter: Admit: 2022-10-06 | Discharge: 2022-10-06 | Payer: MEDICARE

## 2022-10-06 MED ORDER — METOPROLOL SUCCINATE 50 MG PO TB24
ORAL_TABLET | ORAL | 3 refills | 90.00000 days | Status: AC
Start: 2022-10-06 — End: ?

## 2022-10-06 MED ORDER — XARELTO 20 MG PO TAB
ORAL_TABLET | ORAL | 3 refills | 30.00000 days | Status: AC
Start: 2022-10-06 — End: ?

## 2022-10-06 MED ORDER — SPIRONOLACTONE 50 MG PO TAB
ORAL_TABLET | ORAL | 3 refills | 90.00000 days | Status: AC
Start: 2022-10-06 — End: ?

## 2023-05-05 ENCOUNTER — Encounter: Admit: 2023-05-05 | Discharge: 2023-05-05 | Payer: MEDICARE

## 2023-06-09 ENCOUNTER — Encounter: Admit: 2023-06-09 | Discharge: 2023-06-09 | Payer: MEDICARE

## 2023-06-09 MED ORDER — SPIRONOLACTONE 50 MG PO TAB
ORAL_TABLET | ORAL | 3 refills | 90.00000 days | Status: AC
Start: 2023-06-09 — End: ?

## 2023-06-09 MED ORDER — METOPROLOL SUCCINATE 50 MG PO TB24
ORAL_TABLET | ORAL | 3 refills | 90.00000 days | Status: AC
Start: 2023-06-09 — End: ?

## 2023-06-09 MED ORDER — LOSARTAN 25 MG PO TAB
ORAL_TABLET | ORAL | 3 refills | 90.00000 days | Status: AC
Start: 2023-06-09 — End: ?

## 2023-06-09 MED ORDER — XARELTO 20 MG PO TAB
ORAL_TABLET | ORAL | 3 refills | 30.00000 days | Status: AC
Start: 2023-06-09 — End: ?

## 2023-06-09 MED ORDER — LOSARTAN 50 MG PO TAB
ORAL_TABLET | ORAL | 3 refills | 90.00000 days | Status: AC
Start: 2023-06-09 — End: ?

## 2023-07-01 ENCOUNTER — Encounter: Admit: 2023-07-01 | Discharge: 2023-07-01 | Payer: MEDICARE

## 2023-07-02 ENCOUNTER — Encounter: Admit: 2023-07-02 | Discharge: 2023-07-02 | Payer: MEDICARE

## 2023-07-12 ENCOUNTER — Encounter: Admit: 2023-07-12 | Discharge: 2023-07-12 | Payer: MEDICARE

## 2023-07-20 ENCOUNTER — Encounter: Admit: 2023-07-20 | Discharge: 2023-07-20 | Payer: MEDICARE

## 2023-07-20 ENCOUNTER — Ambulatory Visit: Admit: 2023-07-20 | Discharge: 2023-07-21 | Payer: MEDICARE

## 2023-09-06 ENCOUNTER — Ambulatory Visit: Admit: 2023-09-06 | Discharge: 2023-09-06 | Payer: MEDICARE

## 2023-09-06 ENCOUNTER — Encounter: Admit: 2023-09-06 | Discharge: 2023-09-06 | Payer: MEDICARE

## 2024-03-21 ENCOUNTER — Encounter: Admit: 2024-03-21 | Discharge: 2024-03-21 | Payer: MEDICARE

## 2024-05-26 ENCOUNTER — Encounter: Admit: 2024-05-26 | Discharge: 2024-05-26 | Payer: MEDICARE

## 2024-05-26 MED ORDER — XARELTO 20 MG PO TAB
ORAL_TABLET | ORAL | 11 refills | 30.00000 days | Status: AC
Start: 2024-05-26 — End: ?

## 2024-05-26 MED ORDER — LOSARTAN 50 MG PO TAB
ORAL_TABLET | ORAL | 11 refills | 90.00000 days | Status: AC
Start: 2024-05-26 — End: ?

## 2024-05-26 MED ORDER — SPIRONOLACTONE 50 MG PO TAB
ORAL_TABLET | ORAL | 11 refills | 90.00000 days | Status: AC
Start: 2024-05-26 — End: ?

## 2024-05-26 MED ORDER — METOPROLOL SUCCINATE 50 MG PO TB24
ORAL_TABLET | ORAL | 11 refills | 90.00000 days | Status: AC
Start: 2024-05-26 — End: ?

## 2024-05-26 MED ORDER — LOSARTAN 25 MG PO TAB
ORAL_TABLET | ORAL | 11 refills | 90.00000 days | Status: AC
Start: 2024-05-26 — End: ?
